# Patient Record
Sex: Female | Born: 1985 | Race: White | Hispanic: No | Marital: Single | Smoking: Never smoker
Health system: Southern US, Community
[De-identification: ages and names within clinical notes are randomized; demographics above are authoritative.]

## PROBLEM LIST (undated history)

## (undated) DIAGNOSIS — J45909 Unspecified asthma, uncomplicated: Secondary | ICD-10-CM

## (undated) DIAGNOSIS — G43909 Migraine, unspecified, not intractable, without status migrainosus: Secondary | ICD-10-CM

## (undated) DIAGNOSIS — F419 Anxiety disorder, unspecified: Secondary | ICD-10-CM

## (undated) DIAGNOSIS — G4700 Insomnia, unspecified: Secondary | ICD-10-CM

## (undated) HISTORY — PX: OTHER SURGICAL HISTORY: SHX169

## (undated) HISTORY — PX: HERNIA REPAIR: SHX51

## (undated) HISTORY — PX: ABDOMINAL HYSTERECTOMY: SHX81

## (undated) HISTORY — PX: TONSILLECTOMY: SUR1361

## (undated) HISTORY — PX: ABDOMINAL SURGERY: SHX537

## (undated) HISTORY — PX: ADENOIDECTOMY: SUR15

## (undated) HISTORY — PX: CHOLECYSTECTOMY: SHX55

## (undated) HISTORY — PX: APPENDECTOMY: SHX54

---
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
## 2003-01-06 ENCOUNTER — Ambulatory Visit (HOSPITAL_COMMUNITY): Admission: RE | Admit: 2003-01-06 | Discharge: 2003-01-06 | Payer: Self-pay | Admitting: Obstetrics and Gynecology

## 2003-01-06 ENCOUNTER — Encounter: Payer: Self-pay | Admitting: Obstetrics and Gynecology

## 2003-06-01 ENCOUNTER — Ambulatory Visit (HOSPITAL_COMMUNITY): Admission: RE | Admit: 2003-06-01 | Discharge: 2003-06-01 | Payer: Self-pay | Admitting: Surgery

## 2003-09-12 ENCOUNTER — Ambulatory Visit (HOSPITAL_BASED_OUTPATIENT_CLINIC_OR_DEPARTMENT_OTHER): Admission: RE | Admit: 2003-09-12 | Discharge: 2003-09-12 | Payer: Self-pay | Admitting: Internal Medicine

## 2004-09-10 ENCOUNTER — Ambulatory Visit: Payer: Self-pay | Admitting: Internal Medicine

## 2005-01-02 ENCOUNTER — Emergency Department (HOSPITAL_COMMUNITY): Admission: EM | Admit: 2005-01-02 | Discharge: 2005-01-02 | Payer: Self-pay | Admitting: Emergency Medicine

## 2006-10-09 ENCOUNTER — Emergency Department (HOSPITAL_COMMUNITY): Admission: EM | Admit: 2006-10-09 | Discharge: 2006-10-09 | Payer: Self-pay | Admitting: Emergency Medicine

## 2015-03-06 ENCOUNTER — Encounter (HOSPITAL_COMMUNITY): Payer: Self-pay | Admitting: Emergency Medicine

## 2015-03-06 ENCOUNTER — Emergency Department (HOSPITAL_COMMUNITY)
Admission: EM | Admit: 2015-03-06 | Discharge: 2015-03-06 | Discharge status: Against Medical Advice | Payer: Medicaid Other | Attending: Emergency Medicine | Admitting: Emergency Medicine

## 2015-03-06 DIAGNOSIS — R531 Weakness: Secondary | ICD-10-CM | POA: Diagnosis not present

## 2015-03-06 DIAGNOSIS — J45909 Unspecified asthma, uncomplicated: Secondary | ICD-10-CM | POA: Insufficient documentation

## 2015-03-06 HISTORY — DX: Unspecified asthma, uncomplicated: J45.909

## 2015-03-06 HISTORY — DX: Insomnia, unspecified: G47.00

## 2015-03-06 HISTORY — DX: Migraine, unspecified, not intractable, without status migrainosus: G43.909

## 2015-03-06 HISTORY — DX: Anxiety disorder, unspecified: F41.9

## 2015-03-06 NOTE — ED Notes (Signed)
Pt called to room no answer in waiting room.

## 2015-03-06 NOTE — ED Notes (Signed)
No answer in waiting room 

## 2015-03-06 NOTE — ED Notes (Signed)
Called x 1 for room assignment. No answer.  

## 2015-03-06 NOTE — ED Notes (Signed)
Pt /co feeling generally weak and tingly with bilateral hand/arm numbness and some slurred speech x 90 minutes ago. Pt states symptoms have since resolved, with the exception of her right arm feeling heavy and light headness. Speech clear. Pt ambulated into triage with steady gait. Pt states she hash/s of anxiety.

## 2015-03-20 ENCOUNTER — Emergency Department (HOSPITAL_COMMUNITY): Payer: Medicaid Other

## 2015-03-20 ENCOUNTER — Encounter (HOSPITAL_COMMUNITY): Payer: Self-pay | Admitting: Emergency Medicine

## 2015-03-20 ENCOUNTER — Emergency Department (HOSPITAL_COMMUNITY)
Admission: EM | Admit: 2015-03-20 | Discharge: 2015-03-20 | Disposition: A | Discharge status: Home / Self Care | Payer: Medicaid Other | Attending: Emergency Medicine | Admitting: Emergency Medicine

## 2015-03-20 DIAGNOSIS — Z880 Allergy status to penicillin: Secondary | ICD-10-CM | POA: Diagnosis not present

## 2015-03-20 DIAGNOSIS — G4700 Insomnia, unspecified: Secondary | ICD-10-CM | POA: Insufficient documentation

## 2015-03-20 DIAGNOSIS — K469 Unspecified abdominal hernia without obstruction or gangrene: Secondary | ICD-10-CM | POA: Diagnosis not present

## 2015-03-20 DIAGNOSIS — J45909 Unspecified asthma, uncomplicated: Secondary | ICD-10-CM | POA: Insufficient documentation

## 2015-03-20 DIAGNOSIS — G43909 Migraine, unspecified, not intractable, without status migrainosus: Secondary | ICD-10-CM | POA: Insufficient documentation

## 2015-03-20 DIAGNOSIS — Z91040 Latex allergy status: Secondary | ICD-10-CM | POA: Diagnosis not present

## 2015-03-20 DIAGNOSIS — R109 Unspecified abdominal pain: Secondary | ICD-10-CM | POA: Diagnosis present

## 2015-03-20 DIAGNOSIS — Z79899 Other long term (current) drug therapy: Secondary | ICD-10-CM | POA: Diagnosis not present

## 2015-03-20 DIAGNOSIS — K458 Other specified abdominal hernia without obstruction or gangrene: Secondary | ICD-10-CM

## 2015-03-20 DIAGNOSIS — Z8659 Personal history of other mental and behavioral disorders: Secondary | ICD-10-CM | POA: Insufficient documentation

## 2015-03-20 LAB — CBC WITH DIFFERENTIAL/PLATELET
Basophils Absolute: 0 10*3/uL (ref 0.0–0.1)
Basophils Relative: 1 % (ref 0–1)
EOS ABS: 0.1 10*3/uL (ref 0.0–0.7)
EOS PCT: 2 % (ref 0–5)
HCT: 43.1 % (ref 36.0–46.0)
Hemoglobin: 14.3 g/dL (ref 12.0–15.0)
LYMPHS ABS: 1.6 10*3/uL (ref 0.7–4.0)
Lymphocytes Relative: 25 % (ref 12–46)
MCH: 33.2 pg (ref 26.0–34.0)
MCHC: 33.2 g/dL (ref 30.0–36.0)
MCV: 100 fL (ref 78.0–100.0)
Monocytes Absolute: 0.3 10*3/uL (ref 0.1–1.0)
Monocytes Relative: 5 % (ref 3–12)
Neutro Abs: 4.4 10*3/uL (ref 1.7–7.7)
Neutrophils Relative %: 67 % (ref 43–77)
PLATELETS: 210 10*3/uL (ref 150–400)
RBC: 4.31 MIL/uL (ref 3.87–5.11)
RDW: 13.4 % (ref 11.5–15.5)
WBC: 6.4 10*3/uL (ref 4.0–10.5)

## 2015-03-20 LAB — COMPREHENSIVE METABOLIC PANEL
ALT: 15 U/L (ref 14–54)
AST: 18 U/L (ref 15–41)
Albumin: 3.9 g/dL (ref 3.5–5.0)
Alkaline Phosphatase: 81 U/L (ref 38–126)
Anion gap: 7 (ref 5–15)
BILIRUBIN TOTAL: 0.7 mg/dL (ref 0.3–1.2)
BUN: 12 mg/dL (ref 6–20)
CHLORIDE: 109 mmol/L (ref 101–111)
CO2: 23 mmol/L (ref 22–32)
CREATININE: 0.73 mg/dL (ref 0.44–1.00)
Calcium: 8.7 mg/dL — ABNORMAL LOW (ref 8.9–10.3)
GFR calc Af Amer: >60 mL/min (ref >60)
Glucose, Bld: 105 mg/dL — ABNORMAL HIGH (ref 65–99)
Potassium: 3.2 mmol/L — ABNORMAL LOW (ref 3.5–5.1)
Sodium: 139 mmol/L (ref 135–145)
TOTAL PROTEIN: 6.4 g/dL — AB (ref 6.5–8.1)

## 2015-03-20 LAB — URINALYSIS, ROUTINE W REFLEX MICROSCOPIC
BILIRUBIN URINE: NEGATIVE
GLUCOSE, UA: NEGATIVE mg/dL
HGB URINE DIPSTICK: NEGATIVE
KETONES UR: NEGATIVE mg/dL
LEUKOCYTES UA: NEGATIVE
Nitrite: NEGATIVE
PROTEIN: NEGATIVE mg/dL
Specific Gravity, Urine: >1.03 — ABNORMAL HIGH (ref 1.005–1.030)
Urobilinogen, UA: 0.2 mg/dL (ref 0.0–1.0)
pH: 5.5 (ref 5.0–8.0)

## 2015-03-20 MED ORDER — ONDANSETRON 4 MG PO TBDP: 4.0000 mg | ORAL_TABLET | Freq: Three times a day (TID) | ORAL | Status: DC | PRN

## 2015-03-20 MED ORDER — IOHEXOL 300 MG/ML  SOLN
25.0000 mL | Freq: Once | INTRAMUSCULAR | Status: AC | PRN
  Administered 2015-03-20: 50 mL via ORAL

## 2015-03-20 MED ORDER — ONDANSETRON 4 MG PO TBDP
4.0000 mg | ORAL_TABLET | Freq: Three times a day (TID) | ORAL | Status: DC | PRN
  Administered 2015-03-20: 4 mg via ORAL
  Filled 2015-03-20: qty 1

## 2015-03-20 MED ORDER — MORPHINE SULFATE (PF) 2 MG/ML IV SOLN
0.5000 mg | INTRAVENOUS | Status: DC | PRN
  Administered 2015-03-20: 0.5 mg via INTRAVENOUS
  Filled 2015-03-20: qty 1

## 2015-03-20 NOTE — ED Notes (Signed)
Pt reports hernia repair in 2009, states she tore the repair approx 3 weeks ago. Pt states she has had increasing abdominal pain and nausea since.

## 2015-03-20 NOTE — ED Notes (Signed)
Pt made aware to return if symptoms worsen or if any life threatening symptoms occur.   

## 2015-03-20 NOTE — ED Provider Notes (Signed)
CSN: [id number]     Arrival date & time 03/20/15  1113 History   First MD Initiated Contact with Patient 03/20/15 1135     Chief Complaint  Patient presents with  . Abdominal Pain   HPI  Daisy Harris is a 29yo female presenting today for abdominal pain. Reports that she tore her hernia repair three weeks ago while she was lifting her textbooks and has had increasing abdominal pain since that time. Notes one day history of nausea and vomiting. Denies fevers. Notes diarrhea x3 weeks since hernia developed, which she states happened last time she had it. Scheduled to see general surgery in a few weeks. History of abdominal hysterectomy, c- section x2, cholecystectomy, hernia repair 2009, Nissen fundoplication x3 noted.  Past Medical History  Diagnosis Date  . Asthma   . Insomnia   . Migraine   . Anxiety    Past Surgical History  Procedure Laterality Date  . Abdominal hysterectomy    . Cholecystectomy    . Tonsillectomy    . Hernia repair    . Abdominal surgery      bowel surgery  . Nissen fundiplication    . Adenoidectomy     Family History  Problem Relation Age of Onset  . Asthma Mother   . Asthma Father   . Asthma Other    Social History  Substance Use Topics  . Smoking status: Never Smoker   . Smokeless tobacco: Never Used  . Alcohol Use: No     Comment: occasional   OB History    Gravida Para Term Preterm AB TAB SAB Ectopic Multiple Living   2 2 2             Review of Systems  Constitutional: Negative for fever.  Gastrointestinal: Positive for nausea, vomiting, abdominal pain and diarrhea. Negative for constipation.      Allergies  Amoxicillin; Azithromycin; Ceftin; Hibiclens; Peanuts; Penicillins; and Latex  Home Medications   Prior to Admission medications   Medication Sig Start Date End Date Taking? Authorizing Provider  albuterol (PROVENTIL HFA;VENTOLIN HFA) 108 (90 BASE) MCG/ACT inhaler Inhale 1 puff into the lungs every 6 (six) hours as needed for  wheezing or shortness of breath.   Yes Historical Provider, MD  atomoxetine (STRATTERA) 40 MG capsule Take 40 mg by mouth daily.   Yes Historical Provider, MD  cevimeline (EVOXAC) 30 MG capsule Take 30 mg by mouth 3 (three) times daily.   Yes Historical Provider, MD  cyanocobalamin (,VITAMIN B-12,) 1000 MCG/ML injection Inject 1,000 mcg into the muscle once a week.   Yes Historical Provider, MD  Doxepin HCl (SILENOR) 6 MG TABS Take 1 tablet by mouth at bedtime.   Yes Historical Provider, MD  EPINEPHrine 0.3 mg/0.3 mL IJ SOAJ injection Inject 0.3 mg into the muscle once.   Yes Historical Provider, MD  esomeprazole (NEXIUM) 40 MG capsule Take 40 mg by mouth daily at 12 noon.   Yes Historical Provider, MD  estradiol (ESTRING) 2 MG vaginal ring Place 2 mg vaginally every 3 (three) months. follow package directions   Yes Historical Provider, MD  Ibuprofen (ADVIL MIGRAINE) 200 MG CAPS Take 1-2 capsules by mouth daily as needed (migraine).   Yes Historical Provider, MD  lamoTRIgine (LAMICTAL) 100 MG tablet Take 100 mg by mouth daily.   Yes Historical Provider, MD  levalbuterol (XOPENEX) 1.25 MG/0.5ML nebulizer solution Take 1.25 mg by nebulization every 4 (four) hours as needed for wheezing or shortness of breath.   Yes Historical  Provider, MD  levocetirizine (XYZAL) 5 MG tablet Take 5 mg by mouth every evening.   Yes Historical Provider, MD  methylphenidate (RITALIN) 10 MG tablet Take 10 mg by mouth 2 (two) times daily.   Yes Historical Provider, MD  montelukast (SINGULAIR) 10 MG tablet Take 10 mg by mouth at bedtime.   Yes Historical Provider, MD  OnabotulinumtoxinA (BOTOX IJ) Inject as directed every 3 (three) months.   Yes Historical Provider, MD  oxyCODONE-acetaminophen (PERCOCET/ROXICET) 5-325 MG per tablet Take 1 tablet by mouth 3 (three) times daily.   Yes Historical Provider, MD  topiramate (TOPAMAX) 50 MG tablet Take 250 mg by mouth at bedtime.   Yes Historical Provider, MD  zolpidem (AMBIEN CR)  12.5 MG CR tablet Take 12.5 mg by mouth at bedtime.   Yes Historical Provider, MD  ondansetron (ZOFRAN-ODT) 4 MG disintegrating tablet Take 1 tablet (4 mg total) by mouth every 8 (eight) hours as needed for nausea or vomiting. 03/20/15   Glencoe N Rumley, DO   BP 133/62 mmHg  Pulse 55  Temp(Src) 97.9 F (36.6 C) (Oral)  Resp 18  Ht 5\' 3"  (1.6 m)  Wt 140 lb (63.504 kg)  BMI 24.81 kg/m2  SpO2 100% Physical Exam  Constitutional: She is oriented to person, place, and time. She appears well-developed and well-nourished. No distress.  Cardiovascular: Normal rate and regular rhythm.  Exam reveals no gallop and no friction rub.   No murmur heard. Pulmonary/Chest: Effort normal and breath sounds normal. No respiratory distress. She has no wheezes. She has no rales.  Abdominal: Soft. Bowel sounds are normal. She exhibits no distension.  Anterior abdominal hernia palpable with some tenderness  Musculoskeletal: Normal range of motion. She exhibits no edema.  Neurological: She is alert and oriented to person, place, and time.  Skin: Skin is warm and dry. No rash noted.  Psychiatric: She has a normal mood and affect. Her behavior is normal.    ED Course  Procedures (including critical care time) Labs Review Labs Reviewed  COMPREHENSIVE METABOLIC PANEL - Abnormal; Notable for the following:    Potassium 3.2 (*)    Glucose, Bld 105 (*)    Calcium 8.7 (*)    Total Protein 6.4 (*)    All other components within normal limits  URINALYSIS, ROUTINE W REFLEX MICROSCOPIC (NOT AT [facility]) - Abnormal; Notable for the following:    Specific Gravity, Urine >1.030 (*)    All other components within normal limits  CBC WITH DIFFERENTIAL/PLATELET    Imaging Review Ct Abdomen Pelvis Wo Contrast  03/20/2015   CLINICAL DATA:  Abdominal pain. History of multiple abdominal surgeries  EXAM: CT ABDOMEN AND PELVIS WITHOUT CONTRAST  TECHNIQUE: Multidetector CT imaging of the abdomen and pelvis was performed following  the standard protocol without IV contrast.  COMPARISON:  None currently available  FINDINGS: BODY WALL: Diffuse abdominal wall scarring and distortion.  LOWER CHEST: Small hiatal hernia with wrapped appearance compatible with history of Nissen fundoplication.  ABDOMEN/PELVIS:  Liver: No focal abnormality.  Biliary: Cholecystectomy.  No common bile duct enlargement.  Pancreas: Unremarkable.  Spleen: Unremarkable.  Adrenals: Unremarkable.  Kidneys and ureters: No hydronephrosis or stone.  Bladder: Unremarkable.  Reproductive: Hysterectomy and probable oophorectomies. Vaginal ring noted.  Bowel: Thickened appearance of the distal colon which is likely from underdistention. No surrounding inflammatory changes. No bowel obstruction. No appendicitis.  Retroperitoneum: No mass or adenopathy.  Peritoneum: No ascites or pneumoperitoneum.  Vascular: No acute abnormality.  OSSEOUS: No acute abnormalities.  IMPRESSION: 1. No acute finding. 2. Small hiatal hernia including Nissen fundoplication.   Electronically Signed   By: Marnee Spring M.D.   On: 03/20/2015 14:31   I have personally reviewed and evaluated these images and lab results as part of my medical decision-making.   EKG Interpretation None      MDM   Final diagnoses:  Recurrent abdominal hernia without obstruction or gangrene, unspecified hernia type  CMP normal except for mild hypokalemia of 3.2, hyperglycemia of 105. CBC normal. Urinalysis normal. CT scan with small hiatal hernia and no acute findings. Stable for discharge with general surgery follow up as scheduled. Will give Zofran prescription for nausea and vomiting. Continue to take home pain medication for pain.     [place], [place] 03/20/15 1459  Geoffery Lyons, MD 03/21/15 [phone]

## 2015-03-20 NOTE — Discharge Instructions (Signed)

## 2015-12-09 ENCOUNTER — Emergency Department (HOSPITAL_COMMUNITY): Payer: Medicaid Other

## 2015-12-09 ENCOUNTER — Encounter (HOSPITAL_COMMUNITY): Payer: Self-pay | Admitting: Emergency Medicine

## 2015-12-09 ENCOUNTER — Emergency Department (HOSPITAL_COMMUNITY)
Admission: EM | Admit: 2015-12-09 | Discharge: 2015-12-09 | Disposition: A | Discharge status: Home / Self Care | Payer: Medicaid Other | Attending: Emergency Medicine | Admitting: Emergency Medicine

## 2015-12-09 DIAGNOSIS — R0789 Other chest pain: Secondary | ICD-10-CM | POA: Diagnosis present

## 2015-12-09 DIAGNOSIS — Z880 Allergy status to penicillin: Secondary | ICD-10-CM | POA: Diagnosis not present

## 2015-12-09 DIAGNOSIS — Z91040 Latex allergy status: Secondary | ICD-10-CM | POA: Diagnosis not present

## 2015-12-09 DIAGNOSIS — J45901 Unspecified asthma with (acute) exacerbation: Secondary | ICD-10-CM

## 2015-12-09 DIAGNOSIS — J45909 Unspecified asthma, uncomplicated: Secondary | ICD-10-CM | POA: Diagnosis not present

## 2015-12-09 LAB — I-STAT CHEM 8, ED
BUN: 17 mg/dL (ref 6–20)
CREATININE: 1.1 mg/dL — AB (ref 0.44–1.00)
Calcium, Ion: 1.19 mmol/L (ref 1.12–1.23)
Chloride: 102 mmol/L (ref 101–111)
Glucose, Bld: 100 mg/dL — ABNORMAL HIGH (ref 65–99)
HEMATOCRIT: 42 % (ref 36.0–46.0)
HEMOGLOBIN: 14.3 g/dL (ref 12.0–15.0)
POTASSIUM: 3.8 mmol/L (ref 3.5–5.1)
SODIUM: 140 mmol/L (ref 135–145)
TCO2: 23 mmol/L (ref 0–100)

## 2015-12-09 LAB — I-STAT TROPONIN, ED: Troponin i, poc: 0 ng/mL (ref 0.00–0.08)

## 2015-12-09 MED ORDER — PREDNISONE 50 MG PO TABS
60.0000 mg | ORAL_TABLET | Freq: Once | ORAL | Status: AC
  Administered 2015-12-09: 60 mg via ORAL
  Filled 2015-12-09: qty 1

## 2015-12-09 MED ORDER — IPRATROPIUM-ALBUTEROL 0.5-2.5 (3) MG/3ML IN SOLN
3.0000 mL | Freq: Once | RESPIRATORY_TRACT | Status: AC
  Administered 2015-12-09: 3 mL via RESPIRATORY_TRACT
  Filled 2015-12-09: qty 3

## 2015-12-09 MED ORDER — ALBUTEROL SULFATE (2.5 MG/3ML) 0.083% IN NEBU
2.5000 mg | INHALATION_SOLUTION | Freq: Once | RESPIRATORY_TRACT | Status: AC
Start: 2015-12-09 — End: 2015-12-09
  Administered 2015-12-09: 2.5 mg via RESPIRATORY_TRACT
  Filled 2015-12-09: qty 3

## 2015-12-09 MED ORDER — ALBUTEROL SULFATE HFA 108 (90 BASE) MCG/ACT IN AERS: 2.0000 | INHALATION_SPRAY | RESPIRATORY_TRACT | Status: AC | PRN

## 2015-12-09 MED ORDER — PREDNISONE 20 MG PO TABS: 40.0000 mg | ORAL_TABLET | Freq: Every day | ORAL | Status: DC

## 2015-12-09 NOTE — ED Provider Notes (Signed)
CSN: [id number]     Arrival date & time 12/09/15  0212 History   First MD Initiated Contact with Patient 12/09/15 0224     Chief Complaint  Patient presents with  . Chest Pain      HPI Pt was seen at 0230.  Per pt, c/o gradual onset and worsening of persistent wheezing and SOB since 1930 last night.  Describes her symptoms as "my asthma is acting up."  Has been using home MDI with transient relief. 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 Denies palpitations, no back pain, no abd pain, no N/V/D, no fevers, no rash.     Past Medical History  Diagnosis Date  . Asthma   . Insomnia   . Migraine   . Anxiety    Past Surgical History  Procedure Laterality Date  . Abdominal hysterectomy    . Cholecystectomy    . Tonsillectomy    . Hernia repair    . Abdominal surgery      bowel surgery  . Nissen fundiplication    . Adenoidectomy    . Cesarean section    . Appendectomy     Family History  Problem Relation Age of Onset  . Asthma Mother   . Asthma Father   . Asthma Other    Social History  Substance Use Topics  . Smoking status: Never Smoker   . Smokeless tobacco: Never Used  . Alcohol Use: No     Comment: occasional   OB History    Gravida Para Term Preterm AB TAB SAB Ectopic Multiple Living   2 2 2             Review of Systems ROS: Statement: All systems negative except as marked or noted in the HPI; Constitutional: Negative for fever and chills. ; ; Eyes: Negative for eye pain, redness and discharge. ; ; ENMT: Negative for ear pain, hoarseness, nasal congestion, sinus pressure and sore throat. ; ; Cardiovascular: Negative for palpitations, diaphoresis, and peripheral edema. ; ; Respiratory: +CP, wheezing, SOB. Negative for cough and stridor. ; ; Gastrointestinal: Negative for nausea, vomiting, diarrhea, abdominal pain, blood in stool, hematemesis, jaundice and rectal bleeding.  . ; ; Genitourinary: Negative for dysuria, flank pain and hematuria. ; ; Musculoskeletal: Negative for back pain and neck pain. Negative for swelling and trauma.; ; Skin: Negative for pruritus, rash, abrasions, blisters, bruising and skin lesion.; ; Neuro: Negative for headache, lightheadedness and neck stiffness. Negative for weakness, altered level of consciousness, altered mental status, extremity weakness, paresthesias, involuntary movement, seizure and syncope.      Allergies  Amoxicillin; Azithromycin; Ceftin; Hibiclens; Peanuts; Penicillins; and Latex  Home Medications   Prior to Admission medications   Medication Sig Start Date End Date Taking? Authorizing Provider  albuterol (PROVENTIL HFA;VENTOLIN HFA) 108 (90 BASE) MCG/ACT inhaler Inhale 1 puff into the lungs every 6 (six) hours as needed for wheezing or shortness of breath.    Historical Provider, MD  atomoxetine (STRATTERA) 40 MG capsule Take 40 mg by mouth daily.    Historical Provider, MD  cevimeline (EVOXAC) 30 MG capsule Take 30 mg by mouth 3 (three) times daily.    Historical Provider, MD  cyanocobalamin (,VITAMIN B-12,) 1000 MCG/ML injection Inject 1,000 mcg into the muscle once a week.    Historical Provider, MD  Doxepin HCl (SILENOR) 6 MG TABS Take 1 tablet by mouth at bedtime.  Historical Provider, MD  EPINEPHrine 0.3 mg/0.3 mL IJ SOAJ injection Inject 0.3 mg into the muscle once.    Historical Provider, MD  esomeprazole (NEXIUM) 40 MG capsule Take 40 mg by mouth daily at 12 noon.    Historical Provider, MD  estradiol (ESTRING) 2 MG vaginal ring Place 2 mg vaginally every 3 (three) months. follow package directions    Historical Provider, MD  Ibuprofen (ADVIL MIGRAINE) 200 MG CAPS Take 1-2 capsules by mouth daily as needed (migraine).    Historical Provider, MD  lamoTRIgine (LAMICTAL) 100 MG tablet Take 100 mg by mouth daily.    Historical Provider, MD  levalbuterol (XOPENEX) 1.25 MG/0.5ML nebulizer solution Take 1.25 mg  by nebulization every 4 (four) hours as needed for wheezing or shortness of breath.    Historical Provider, MD  levocetirizine (XYZAL) 5 MG tablet Take 5 mg by mouth every evening.    Historical Provider, MD  methylphenidate (RITALIN) 10 MG tablet Take 10 mg by mouth 2 (two) times daily.    Historical Provider, MD  montelukast (SINGULAIR) 10 MG tablet Take 10 mg by mouth at bedtime.    Historical Provider, MD  OnabotulinumtoxinA (BOTOX IJ) Inject as directed every 3 (three) months.    Historical Provider, MD  ondansetron (ZOFRAN-ODT) 4 MG disintegrating tablet Take 1 tablet (4 mg total) by mouth every 8 (eight) hours as needed for nausea or vomiting. 03/20/15   Lora Havensaleigh N Rumley, DO  oxyCODONE-acetaminophen (PERCOCET/ROXICET) 5-325 MG per tablet Take 1 tablet by mouth 3 (three) times daily.    Historical Provider, MD  topiramate (TOPAMAX) 50 MG tablet Take 250 mg by mouth at bedtime.    Historical Provider, MD  zolpidem (AMBIEN CR) 12.5 MG CR tablet Take 12.5 mg by mouth at bedtime.    Historical Provider, MD   BP 134/81 mmHg  Pulse 95  Temp(Src) 97.6 F (36.4 C) (Oral)  Resp 18  Ht 5\' 3"  (1.6 m)  Wt 170 lb (77.111 kg)  BMI 30.12 kg/m2  SpO2 96% Physical Exam  0235: Physical examination:  Nursing notes reviewed; Vital signs and O2 SAT reviewed;  Constitutional: Well developed, Well nourished, Well hydrated, In no acute distress; Head:  Normocephalic, atraumatic; Eyes: EOMI, PERRL, No scleral icterus; ENMT: Mouth and pharynx normal, Mucous membranes moist; Neck: Supple, Full range of motion, No lymphadenopathy; Cardiovascular: Regular rate and rhythm, No murmur, rub, or gallop; Respiratory: Breath sounds diminished & equal bilaterally, scattered exp wheezes. No audible wheezing. Speaking full sentences with ease, Normal respiratory effort/excursion; Chest: Nontender, Movement normal; Abdomen: Soft, Nontender, Nondistended, Normal bowel sounds; Genitourinary: No CVA tenderness; Extremities: Pulses  normal, No tenderness, No edema, No calf edema or asymmetry.; Neuro: AA&Ox3, Major CN grossly intact.  Speech clear. No gross focal motor or sensory deficits in extremities.; Skin: Color normal, Warm, Dry.   ED Course  Procedures (including critical care time) Labs Review   Imaging Review  I have personally reviewed and evaluated these images and lab results as part of my medical decision-making.   EKG Interpretation   Date/Time:  Saturday Dec 09 2015 02:24:23 EDT Ventricular Rate:  96 PR Interval:  158 QRS Duration: 86 QT Interval:  360 QTC Calculation: 455 R Axis:   7 Text Interpretation:  Sinus rhythm LVH by voltage When compared with ECG  of 03/06/2015 No significant change was found Confirmed by [facility] Center[facility]CMANUS  MD,  Nicholos JohnsKATHLEEN [phone]([id number]) on 12/09/2015 3:06:50 AM      MDM  MDM Reviewed: previous chart, nursing note and  vitals Reviewed previous: labs and ECG Interpretation: labs, ECG and x-ray     Results for orders placed or performed during the hospital encounter of 12/09/15  I-stat Chem 8, ED  Result Value Ref Range   Sodium 140 135 - 145 mmol/L   Potassium 3.8 3.5 - 5.1 mmol/L   Chloride 102 101 - 111 mmol/L   BUN 17 6 - 20 mg/dL   Creatinine, Ser 1.61 (H) 0.44 - 1.00 mg/dL   Glucose, Bld 096 (H) 65 - 99 mg/dL   Calcium, Ion 0.45 4.09 - 1.23 mmol/L   TCO2 23 0 - 100 mmol/L   Hemoglobin 14.3 12.0 - 15.0 g/dL   HCT 81.1 91.4 - 78.2 %  I-stat troponin, ED  Result Value Ref Range   Troponin i, poc 0.00 0.00 - 0.08 ng/mL   Comment 3           Dg Chest 2 View 12/09/2015  CLINICAL DATA:  Generalized chest pain and shortness of breath since 8 p.m. last night. Headache. History of asthma. EXAM: CHEST  2 VIEW COMPARISON:  None. FINDINGS: The heart size and mediastinal contours are within normal limits. Both lungs are clear. The visualized skeletal structures are unremarkable. IMPRESSION: No active cardiopulmonary disease. Electronically Signed   By: Burman Nieves M.D.   On:  12/09/2015 03:30     0340:  Doubt PE as cause for symptoms with normal d-dimer, PERC negative, and low risk Wells.  Doubt ACS as cause for symptoms with normal troponin and unchanged EKG from previous after 8 hours of constant symptoms. Pt states she "feels better" after neb and steroid.  NAD, lungs CTA bilat, no wheezing, resps easy, speaking full sentences, Sats 100% R/A.  Pt states she wants to go home now. Dx and testing d/w pt.  Questions answered.  Verb understanding, agreeable to d/c home with outpt f/u.     Samuel Jester, DO 12/12/15 1816

## 2015-12-09 NOTE — Discharge Instructions (Signed)
Take the prescriptions as directed.  Use your albuterol inhaler (2 to 4 puffs) every 4 hours for the next 7 days, then as needed for cough, wheezing, or shortness of breath.  Call your regular medical doctor Monday morning to schedule a follow up appointment within the next 3 days.  Return to the Emergency Department immediately sooner if worsening.  ° °

## 2015-12-09 NOTE — ED Notes (Signed)
Pt states she started a new job and started having chest pain with SOB around 0100.  Pt took a break and used inhaler.  Had some relief until she returned to work.  Now having chest pain described as tightness and headache

## 2015-12-15 ENCOUNTER — Emergency Department (HOSPITAL_COMMUNITY)
Admission: EM | Admit: 2015-12-15 | Discharge: 2015-12-15 | Disposition: A | Discharge status: Home / Self Care | Payer: Medicaid Other | Source: Home / Self Care | Attending: Emergency Medicine | Admitting: Emergency Medicine

## 2015-12-15 ENCOUNTER — Emergency Department (HOSPITAL_COMMUNITY)
Admission: EM | Admit: 2015-12-15 | Discharge: 2015-12-16 | Disposition: A | Discharge status: Home / Self Care | Payer: Medicaid Other | Attending: Emergency Medicine | Admitting: Emergency Medicine

## 2015-12-15 ENCOUNTER — Encounter (HOSPITAL_COMMUNITY): Payer: Self-pay | Admitting: Emergency Medicine

## 2015-12-15 ENCOUNTER — Encounter (HOSPITAL_COMMUNITY): Payer: Self-pay

## 2015-12-15 DIAGNOSIS — R079 Chest pain, unspecified: Secondary | ICD-10-CM | POA: Insufficient documentation

## 2015-12-15 DIAGNOSIS — Z791 Long term (current) use of non-steroidal anti-inflammatories (NSAID): Secondary | ICD-10-CM | POA: Diagnosis not present

## 2015-12-15 DIAGNOSIS — T7801XD Anaphylactic reaction due to peanuts, subsequent encounter: Secondary | ICD-10-CM | POA: Insufficient documentation

## 2015-12-15 DIAGNOSIS — J45909 Unspecified asthma, uncomplicated: Secondary | ICD-10-CM | POA: Insufficient documentation

## 2015-12-15 DIAGNOSIS — T781XXA Other adverse food reactions, not elsewhere classified, initial encounter: Secondary | ICD-10-CM

## 2015-12-15 DIAGNOSIS — R0789 Other chest pain: Secondary | ICD-10-CM | POA: Insufficient documentation

## 2015-12-15 DIAGNOSIS — T782XXD Anaphylactic shock, unspecified, subsequent encounter: Secondary | ICD-10-CM

## 2015-12-15 DIAGNOSIS — R51 Headache: Secondary | ICD-10-CM | POA: Insufficient documentation

## 2015-12-15 DIAGNOSIS — Z79899 Other long term (current) drug therapy: Secondary | ICD-10-CM | POA: Insufficient documentation

## 2015-12-15 DIAGNOSIS — T7840XA Allergy, unspecified, initial encounter: Secondary | ICD-10-CM

## 2015-12-15 MED ORDER — DIPHENHYDRAMINE HCL 50 MG/ML IJ SOLN
25.0000 mg | Freq: Once | INTRAMUSCULAR | Status: AC
  Administered 2015-12-15: 25 mg via INTRAVENOUS
  Filled 2015-12-15: qty 1

## 2015-12-15 MED ORDER — EPINEPHRINE 0.3 MG/0.3ML IJ SOAJ: 0.3000 mg | Freq: Once | INTRAMUSCULAR | Status: AC

## 2015-12-15 MED ORDER — DEXAMETHASONE SODIUM PHOSPHATE 10 MG/ML IJ SOLN
10.0000 mg | Freq: Once | INTRAMUSCULAR | Status: AC
  Administered 2015-12-15: 10 mg via INTRAVENOUS
  Filled 2015-12-15: qty 1

## 2015-12-15 MED ORDER — EPINEPHRINE 0.3 MG/0.3ML IJ SOAJ
0.3000 mg | Freq: Once | INTRAMUSCULAR | Status: AC
  Administered 2015-12-15: 0.3 mg via INTRAMUSCULAR
  Filled 2015-12-15: qty 0.3

## 2015-12-15 MED ORDER — METHYLPREDNISOLONE SODIUM SUCC 125 MG IJ SOLR
125.0000 mg | Freq: Once | INTRAMUSCULAR | Status: AC
  Administered 2015-12-15: 125 mg via INTRAVENOUS
  Filled 2015-12-15: qty 2

## 2015-12-15 MED ORDER — DIPHENHYDRAMINE HCL 50 MG/ML IJ SOLN
50.0000 mg | Freq: Once | INTRAMUSCULAR | Status: AC
  Administered 2015-12-15: 50 mg via INTRAVENOUS
  Filled 2015-12-15: qty 1

## 2015-12-15 MED ORDER — SODIUM CHLORIDE 0.9 % IV BOLUS (SEPSIS)
1000.0000 mL | Freq: Once | INTRAVENOUS | Status: AC
  Administered 2015-12-15: 1000 mL via INTRAVENOUS

## 2015-12-15 MED ORDER — FAMOTIDINE IN NACL 20-0.9 MG/50ML-% IV SOLN
20.0000 mg | Freq: Once | INTRAVENOUS | Status: AC
  Administered 2015-12-15: 20 mg via INTRAVENOUS
  Filled 2015-12-15: qty 50

## 2015-12-15 MED ORDER — PREDNISONE 20 MG PO TABS: 40.0000 mg | ORAL_TABLET | Freq: Every day | ORAL | Status: DC

## 2015-12-15 NOTE — ED Notes (Signed)
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 Pt states CP, SOB, and feeling of throat closing. 2 epi pens used and 50 mg of Benadryl IV per EMS.

## 2015-12-15 NOTE — ED Provider Notes (Signed)
Recheck:   Feeling better. Ready to go home.  Donnetta HutchingBrian Carlesha Seiple, MD 12/15/15 [phone]

## 2015-12-15 NOTE — ED Provider Notes (Signed)
CSN: [id number]     Arrival date & time 12/15/15  4098 History   First MD Initiated Contact with Patient 12/15/15 0518     Chief Complaint  Patient presents with  . Chest Pain  . Allergic Reaction     (Consider location/radiation/quality/duration/timing/severity/associated sxs/prior Treatment) Patient is a 30 y.o. female presenting with chest pain and allergic reaction. The history is provided by the patient.  Chest Pain Allergic Reaction She has a history of asthma and allergy. 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 She thinks she was exposed to some peanuts this evening when she noted that her face got blotchy and her throat felt like it is closing up. She was having some difficulty breathing. She used her EpiPen twice and her albuterol inhaler and came to the emergency department. She still feels like her throat is mildly closing but breathing has improved. She had chest pain initially but that is resolved. EMS apparently gave her 50 mg of diphenhydramine en route. Of note, she had been seen in the emergency department 5 days ago and is just completing a course of prednisone 40 mg a day.  Past Medical History  Diagnosis Date  . Asthma   . Insomnia   . Migraine   . Anxiety    Past Surgical History  Procedure Laterality Date  . Abdominal hysterectomy    . Cholecystectomy    . Tonsillectomy    . Hernia repair    . Abdominal surgery      bowel surgery  . Nissen fundiplication    . Adenoidectomy    . Cesarean section    . Appendectomy     Family History  Problem Relation Age of Onset  . Asthma Mother   . Asthma Father   . Asthma Other    Social History  Substance Use Topics  . Smoking status: Never Smoker   . Smokeless tobacco: Never Used  . Alcohol Use: No     Comment: occasional   OB History    Gravida Para Term Preterm AB TAB SAB Ectopic Multiple Living   2 2 2             Review of Systems  Cardiovascular: Positive for chest  pain.  All other systems reviewed and are negative.     Allergies  Amoxicillin; Azithromycin; Ceftin; Ciprofloxacin; Hibiclens; Peanuts; Penicillins; and Latex  Home Medications   Prior to Admission medications   Medication Sig Start Date End Date Taking? Authorizing Provider  albuterol (PROVENTIL HFA;VENTOLIN HFA) 108 (90 Base) MCG/ACT inhaler Inhale 2 puffs into the lungs every 4 (four) hours as needed for wheezing or shortness of breath. 12/09/15   Samuel Jester, DO  atomoxetine (STRATTERA) 40 MG capsule Take 40 mg by mouth daily.    Historical Provider, MD  cevimeline (EVOXAC) 30 MG capsule Take 30 mg by mouth 3 (three) times daily.    Historical Provider, MD  cyanocobalamin (,VITAMIN B-12,) 1000 MCG/ML injection Inject 1,000 mcg into the muscle once a week.    Historical Provider, MD  Doxepin HCl (SILENOR) 6 MG TABS Take 1 tablet by mouth at bedtime.    Historical Provider, MD  EPINEPHrine 0.3 mg/0.3 mL IJ SOAJ injection Inject 0.3 mg into the muscle once.    Historical Provider, MD  esomeprazole (NEXIUM) 40 MG capsule Take 40 mg by mouth daily at 12 noon.    Historical Provider, MD  estradiol (ESTRING) 2 MG vaginal ring Place 2 mg vaginally every  3 (three) months. follow package directions    Historical Provider, MD  Ibuprofen (ADVIL MIGRAINE) 200 MG CAPS Take 1-2 capsules by mouth daily as needed (migraine).    Historical Provider, MD  lamoTRIgine (LAMICTAL) 100 MG tablet Take 100 mg by mouth daily.    Historical Provider, MD  levalbuterol (XOPENEX) 1.25 MG/0.5ML nebulizer solution Take 1.25 mg by nebulization every 4 (four) hours as needed for wheezing or shortness of breath.    Historical Provider, MD  levocetirizine (XYZAL) 5 MG tablet Take 5 mg by mouth every evening.    Historical Provider, MD  methylphenidate (RITALIN) 10 MG tablet Take 10 mg by mouth 2 (two) times daily.    Historical Provider, MD  montelukast (SINGULAIR) 10 MG tablet Take 10 mg by mouth at bedtime.     Historical Provider, MD  OnabotulinumtoxinA (BOTOX IJ) Inject as directed every 3 (three) months.    Historical Provider, MD  ondansetron (ZOFRAN-ODT) 4 MG disintegrating tablet Take 1 tablet (4 mg total) by mouth every 8 (eight) hours as needed for nausea or vomiting. 03/20/15   Lora Havens Rumley, DO  oxyCODONE-acetaminophen (PERCOCET/ROXICET) 5-325 MG per tablet Take 1 tablet by mouth 3 (three) times daily.    Historical Provider, MD  predniSONE (DELTASONE) 20 MG tablet Take 2 tablets (40 mg total) by mouth daily. 12/09/15   Samuel Jester, DO  topiramate (TOPAMAX) 50 MG tablet Take 250 mg by mouth at bedtime.    Historical Provider, MD  zolpidem (AMBIEN CR) 12.5 MG CR tablet Take 12.5 mg by mouth at bedtime.    Historical Provider, MD   There were no vitals taken for this visit. Physical Exam  Nursing note and vitals reviewed.  30 year old female, somewhat anxious and jittery, but in no acute distress. Vital signs are significant for tachycardia. Oxygen saturation is 99%, which is normal. Head is normocephalic and atraumatic. PERRLA, EOMI. Oropharynx is clear. There is no edema of the uvula or soft tissues the pharynx. There is no stridor. There is no pooling of secretions and phonation is normal. Neck is nontender and supple without adenopathy or JVD. Back is nontender and there is no CVA tenderness. Lungs are clear without rales, wheezes, or rhonchi. Chest is nontender. Heart his tachycardic without murmur. Abdomen is soft, flat, nontender without masses or hepatosplenomegaly and peristalsis is normoactive. Extremities have no cyanosis or edema, full range of motion is present. Skin is warm and dry without rash. Neurologic: Mental status is normal, cranial nerves are intact, there are no motor or sensory deficits.  ED Course  Procedures (including critical care time)   EKG Interpretation   Date/Time:  Friday Dec 15 2015 05:17:24 EDT Ventricular Rate:  110 PR Interval:  182 QRS  Duration: 96 QT Interval:  329 QTC Calculation: 445 R Axis:   -8 Text Interpretation:  Sinus tachycardia Left atrial enlargement Left  ventricular hypertrophy When compared with ECG of 12/09/2015, No  significant change was found Confirmed by [facility]  MD, Mckenzie Toruno ([id number]) on  12/15/2015 5:20:46 AM      MDM   Final diagnoses:  Allergic reaction, initial encounter    Apparent allergic reaction to peanuts. Although anxious, patient does not appear to be in distress currently. No evidence of ongoing bronchospasm. She's given additional diphenhydramine as well as famotidine and methylprednisolone and will need to be observed in the emergency department. Review of old records confirms recent ED visit for asthma exacerbation.  7:43 AM Following above-noted treatment, she feels much better.  Some of her facial flushing has resolved, heart rate has come down and she is much more comfortable. She will continue to be observed in the ED for another hour. She will be discharged with prescriptions for an new EpiPen and for a 5 day course of prednisone.  Dione Boozeavid Alannis Hsia, MD 12/15/15 [phone]

## 2015-12-15 NOTE — ED Notes (Signed)
Pt states CP started prior to reaction. Reports her throat "hasn't opened up all the way".

## 2015-12-15 NOTE — ED Notes (Signed)
Pt c/o increasing chest tightness and states she is "breaking out". No obvious rash or welts noted at this time. MD Preston FleetingGlick notified.

## 2015-12-15 NOTE — ED Provider Notes (Signed)
CSN: [id number]     Arrival date & time 12/15/15  2247 History  By signing my name below, I, [facility], attest that this documentation has been prepared under the direction and in the presence of [name], MD. Electronically Signed: Randell Patient, ED Scribe. 12/16/2015. 12:31 AM.    Chief Complaint  Patient presents with  . Allergic Reaction   The history is provided by the patient. No language interpreter was used.  HPI Comments: Daisy Harris is a 30 y.o. female with an hx of asthma and a severe peanut allergy who presents to the Emergency Department complaining of a possible allergic reaction that occurred 20 minutes PTA. Pt states that she had an allergic reaction to peanuts earlier this morning after which she used 2 EpiPens, was transported to the ED, and was evaluated here at the [facility] ED by Dr. Donnetta Hutching. She was discharged with prescription for prednisone which she has not filled. She reports that her symptoms re-occurred this evening when she felt feverish followed by a sensation of swelling in her throat, an erythematous rash to her face and chest, chest tightness, and a HA. Per pt, she notes similar symptoms in the past when she was exposed to peanuts that required treatment in the hospital. Denies re-exposure to peanuts. Denies any other symptoms currently.  Past Medical History  Diagnosis Date  . Asthma   . Insomnia   . Migraine   . Anxiety    Past Surgical History  Procedure Laterality Date  . Abdominal hysterectomy    . Cholecystectomy    . Tonsillectomy    . Hernia repair    . Abdominal surgery      bowel surgery  . Nissen fundiplication    . Adenoidectomy    . Cesarean section    . Appendectomy     Family History  Problem Relation Age of Onset  . Asthma Mother   . Asthma Father   . Asthma Other    Social History  Substance Use Topics  . Smoking status: Never Smoker   . Smokeless tobacco: Never Used  . Alcohol Use: No     Comment:  occasional   OB History    Gravida Para Term Preterm AB TAB SAB Ectopic Multiple Living   2 2 2             Review of Systems  HENT: Positive for trouble swallowing.   Respiratory: Positive for chest tightness.   Skin: Positive for color change and rash.  Neurological: Positive for headaches.  All other systems reviewed and are negative.   Allergies  Amoxicillin; Azithromycin; Ceftin; Ciprofloxacin; Hibiclens; Peanuts; Penicillins; and Latex  Home Medications   Prior to Admission medications   Medication Sig Start Date End Date Taking? Authorizing Provider  albuterol (PROVENTIL HFA;VENTOLIN HFA) 108 (90 Base) MCG/ACT inhaler Inhale 2 puffs into the lungs every 4 (four) hours as needed for wheezing or shortness of breath. 12/09/15  Yes Samuel Jester, DO  EPINEPHrine 0.3 mg/0.3 mL IJ SOAJ injection Inject 0.3 mLs (0.3 mg total) into the muscle once. 12/15/15  Yes Dione Booze, MD  estradiol (ESTRACE) 2 MG tablet Take 2 mg by mouth daily.   Yes Historical Provider, MD  Ibuprofen (ADVIL MIGRAINE) 200 MG CAPS Take 1-2 capsules by mouth daily as needed (for pain).   Yes Historical Provider, MD  levalbuterol (XOPENEX) 1.25 MG/0.5ML nebulizer solution Take 1.25 mg by nebulization every 4 (four) hours as needed for wheezing or shortness of breath.  Yes Historical Provider, MD  predniSONE (DELTASONE) 20 MG tablet Take 2 tablets (40 mg total) by mouth daily. 12/15/15   Dione Boozeavid Glick, MD   BP 118/72 mmHg  Pulse 82  Temp(Src) 98.1 F (36.7 C) (Oral)  Resp 24  Ht 5\' 3"  (1.6 m)  Wt 180 lb (81.647 kg)  BMI 31.89 kg/m2  SpO2 100% Physical Exam  Constitutional: She is oriented to person, place, and time. She appears well-developed and well-nourished. No distress.  HENT:  Head: Normocephalic and atraumatic.  No significant swelling appreciated. No significant angioedema.  Eyes: Conjunctivae are normal.  Neck: Normal range of motion.  No stridor.  Cardiovascular: Normal rate.    Pulmonary/Chest: Effort normal. No stridor. No respiratory distress.   Lungs CTA bilaterally.  Musculoskeletal: Normal range of motion.  Neurological: She is alert and oriented to person, place, and time.  Skin: Skin is warm and dry. Rash noted. There is erythema.  Mild erythematous rash to the bilateral face and chest with warmth to palpation and evidence of hives.  Psychiatric: She has a normal mood and affect. Her behavior is normal.  Nursing note and vitals reviewed.   ED Course  Procedures (including critical care time) CRITICAL CARE Performed by: Enid SkeensZAVITZ, Ilyana Manuele M   Total critical care time: 35 minutes  Critical care time was exclusive of separately billable procedures and treating other patients.  Critical care was necessary to treat or prevent imminent or life-threatening deterioration.  Critical care was time spent personally by me on the following activities: development of treatment plan with patient and/or surrogate as well as nursing, discussions with consultants, evaluation of patient's response to treatment, examination of patient, obtaining history from patient or surrogate, ordering and performing treatments and interventions, ordering and review of laboratory studies, ordering and review of radiographic studies, pulse oximetry and re-evaluation of patient's condition.  DIAGNOSTIC STUDIES: Oxygen Saturation is 100% on RA, normal by my interpretation.    COORDINATION OF CARE: 11:04 PM Will order Epi-Pen, Decadron, Benadryl, and IV fluids. Discussed treatment plan with pt at bedside and pt agreed to plan.   Labs Review Labs Reviewed - No data to display  Imaging Review No results found. I have personally reviewed and evaluated these images and lab results as part of my medical decision-making.   EKG Interpretation None      MDM   Final diagnoses:  Anaphylaxis, subsequent encounter   Patient presents with recurrent anaphylaxis. Patient recent discharge  for similar. Epinephrine, IV fluids, Benadryl ordered. Plan for observation and likely  close outpatient follow-up.   Sxs resolved on multiple rechecks, observed in ED for 6 hrs.  Results and differential diagnosis were discussed with the patient/parent/guardian. Xrays were independently reviewed by myself.  Close follow up outpatient was discussed, comfortable with the plan.   Medications  EPINEPHrine (EPI-PEN) injection 0.3 mg (0.3 mg Intramuscular Given 12/15/15 2316)  dexamethasone (DECADRON) injection 10 mg (10 mg Intravenous Given 12/15/15 2316)  diphenhydrAMINE (BENADRYL) injection 50 mg (50 mg Intravenous Given 12/15/15 2316)  sodium chloride 0.9 % bolus 1,000 mL (1,000 mLs Intravenous New Bag/Given 12/15/15 2316)    Filed Vitals:   12/15/15 2254 12/15/15 2330  BP: 129/83 118/72  Pulse: 68 82  Temp: 98.1 F (36.7 C)   TempSrc: Oral   Resp: 16 24  Height: 5\' 3"  (1.6 m)   Weight: 180 lb (81.647 kg)   SpO2: 100% 100%    Final diagnoses:  Anaphylaxis, subsequent encounter      Blane OharaJoshua Coye Dawood,  MD 12/16/15 1610

## 2015-12-15 NOTE — Discharge Instructions (Signed)

## 2015-12-15 NOTE — ED Notes (Signed)
Pt was seen here in the e.d. Last night for allergic reaction to peanuts.  Pt states she had been doing fine until about 20 mins ago when her face started feeling hot and the right side of her throat feels like it is closing up.   No resp distress noted at this time.

## 2015-12-15 NOTE — ED Notes (Signed)
Patient ambulated to restroom at this time.

## 2015-12-16 NOTE — ED Notes (Signed)
Pt alert & oriented x4, stable gait. Patient given discharge instructions, paperwork & prescription(s). Patient  instructed to stop at the registration desk to finish any additional paperwork. Patient verbalized understanding. Pt left department w/ no further questions. 

## 2015-12-16 NOTE — Discharge Instructions (Signed)
If you were given medicines take as directed.  If you are on coumadin or contraceptives realize their levels and effectiveness is altered by many different medicines.  If you have any reaction (rash, tongues swelling, other) to the medicines stop taking and see a physician.    If your blood pressure was elevated in the ER make sure you follow up for management with a primary doctor or return for chest pain, throat or tongue swelling, shortness of breath or stroke symptoms.  Please follow up as directed and return to the ER or see a physician for new or worsening symptoms.  Thank you. Filed Vitals:   12/16/15 0200 12/16/15 0230 12/16/15 0330 12/16/15 0400  BP: 116/75 114/75 115/70 116/79  Pulse: 66 71 78 81  Temp:      TempSrc:      Resp: 18 20 16 21   Height:      Weight:      SpO2: 100% 100% 100% 94%

## 2015-12-16 NOTE — ED Notes (Signed)
Pt removed from monitor. Pt asked if she was calling a ride. States no one to pick her up until the morning. Pt informed normally not allowed to stay in room but she would be allowed to stay until 0600.

## 2015-12-16 NOTE — ED Notes (Signed)
Pt states she started having chest pains again about 5 minutes ago. EDP notified.

## 2015-12-16 NOTE — ED Notes (Signed)
Pt provided snack at this time. 

## 2015-12-16 NOTE — ED Notes (Signed)
Pt c/o face feeling hot and "red spots" on arms.  No discoloration noted on assessment.  No distress talking or breathing.  Dr. Jodi MourningZavitz made aware.

## 2016-07-20 ENCOUNTER — Emergency Department (HOSPITAL_COMMUNITY)
Admission: EM | Admit: 2016-07-20 | Discharge: 2016-07-20 | Disposition: A | Discharge status: Home / Self Care | Payer: Medicaid Other | Attending: Emergency Medicine | Admitting: Emergency Medicine

## 2016-07-20 ENCOUNTER — Encounter (HOSPITAL_COMMUNITY): Payer: Self-pay | Admitting: *Deleted

## 2016-07-20 ENCOUNTER — Emergency Department (HOSPITAL_COMMUNITY): Payer: Medicaid Other

## 2016-07-20 DIAGNOSIS — Z79899 Other long term (current) drug therapy: Secondary | ICD-10-CM | POA: Insufficient documentation

## 2016-07-20 DIAGNOSIS — J45909 Unspecified asthma, uncomplicated: Secondary | ICD-10-CM | POA: Insufficient documentation

## 2016-07-20 DIAGNOSIS — R630 Anorexia: Secondary | ICD-10-CM | POA: Insufficient documentation

## 2016-07-20 DIAGNOSIS — R1030 Lower abdominal pain, unspecified: Secondary | ICD-10-CM | POA: Insufficient documentation

## 2016-07-20 LAB — URINALYSIS, ROUTINE W REFLEX MICROSCOPIC
Bilirubin Urine: NEGATIVE
Glucose, UA: NEGATIVE mg/dL
Hgb urine dipstick: NEGATIVE
Ketones, ur: NEGATIVE mg/dL
LEUKOCYTES UA: NEGATIVE
Nitrite: NEGATIVE
PROTEIN: NEGATIVE mg/dL
SPECIFIC GRAVITY, URINE: 1.023 (ref 1.005–1.030)
pH: 5 (ref 5.0–8.0)

## 2016-07-20 LAB — CBC WITH DIFFERENTIAL/PLATELET
BASOS ABS: 0 10*3/uL (ref 0.0–0.1)
Basophils Relative: 1 %
EOS ABS: 0.3 10*3/uL (ref 0.0–0.7)
EOS PCT: 6 %
HCT: 41.7 % (ref 36.0–46.0)
Hemoglobin: 13.5 g/dL (ref 12.0–15.0)
LYMPHS PCT: 42 %
Lymphs Abs: 2.1 10*3/uL (ref 0.7–4.0)
MCH: 28.7 pg (ref 26.0–34.0)
MCHC: 32.4 g/dL (ref 30.0–36.0)
MCV: 88.7 fL (ref 78.0–100.0)
Monocytes Absolute: 0.3 10*3/uL (ref 0.1–1.0)
Monocytes Relative: 7 %
Neutro Abs: 2.2 10*3/uL (ref 1.7–7.7)
Neutrophils Relative %: 44 %
PLATELETS: 214 10*3/uL (ref 150–400)
RBC: 4.7 MIL/uL (ref 3.87–5.11)
RDW: 14.6 % (ref 11.5–15.5)
WBC: 5 10*3/uL (ref 4.0–10.5)

## 2016-07-20 LAB — COMPREHENSIVE METABOLIC PANEL
ALT: 19 U/L (ref 14–54)
AST: 19 U/L (ref 15–41)
Albumin: 3.6 g/dL (ref 3.5–5.0)
Alkaline Phosphatase: 80 U/L (ref 38–126)
Anion gap: 4 — ABNORMAL LOW (ref 5–15)
BUN: 14 mg/dL (ref 6–20)
CHLORIDE: 109 mmol/L (ref 101–111)
CO2: 28 mmol/L (ref 22–32)
CREATININE: 0.64 mg/dL (ref 0.44–1.00)
Calcium: 9.1 mg/dL (ref 8.9–10.3)
Glucose, Bld: 103 mg/dL — ABNORMAL HIGH (ref 65–99)
POTASSIUM: 3.9 mmol/L (ref 3.5–5.1)
SODIUM: 141 mmol/L (ref 135–145)
Total Bilirubin: 0.6 mg/dL (ref 0.3–1.2)
Total Protein: 6.4 g/dL — ABNORMAL LOW (ref 6.5–8.1)

## 2016-07-20 LAB — LIPASE, BLOOD: LIPASE: 22 U/L (ref 11–51)

## 2016-07-20 MED ORDER — IOPAMIDOL (ISOVUE-300) INJECTION 61%
100.0000 mL | Freq: Once | INTRAVENOUS | Status: AC | PRN
  Administered 2016-07-20: 100 mL via INTRAVENOUS

## 2016-07-20 MED ORDER — OXYCODONE-ACETAMINOPHEN 5-325 MG PO TABS: 1.0000 | ORAL_TABLET | Freq: Four times a day (QID) | ORAL | 0 refills | Status: DC | PRN

## 2016-07-20 MED ORDER — IOPAMIDOL (ISOVUE-300) INJECTION 61%
INTRAVENOUS | Status: AC
  Filled 2016-07-20: qty 30

## 2016-07-20 MED ORDER — ONDANSETRON 4 MG PO TBDP: ORAL_TABLET | ORAL | 0 refills | Status: DC

## 2016-07-20 MED ORDER — HYDROMORPHONE HCL 1 MG/ML IJ SOLN
1.0000 mg | Freq: Once | INTRAMUSCULAR | Status: AC
  Administered 2016-07-20: 1 mg via INTRAVENOUS
  Filled 2016-07-20: qty 1

## 2016-07-20 MED ORDER — ONDANSETRON HCL 4 MG/2ML IJ SOLN
4.0000 mg | Freq: Once | INTRAMUSCULAR | Status: AC
  Administered 2016-07-20: 4 mg via INTRAVENOUS
  Filled 2016-07-20: qty 2

## 2016-07-20 NOTE — ED Triage Notes (Signed)
Pt is here for abdominal pain in right side of abdomen which she feels may be from her hernia repair having torn.  Pt denies any GU symptoms.  Pt states that the pain feels the same as when her mesh hernia repair tore.  No n/v or fever with this.

## 2016-07-20 NOTE — Discharge Instructions (Signed)
Follow-up with Dr.Rehman next week for follow-up with your surgeon in [place]

## 2016-07-20 NOTE — ED Notes (Signed)
Patient transported to CT 

## 2016-07-20 NOTE — ED Provider Notes (Signed)
[facility]-EMERGENCY DEPT Provider Note   CSN: [id number] Arrival date & time: 07/20/16  1457     History   Chief Complaint Chief Complaint  Patient presents with  . Abdominal Pain    HPI Daisy Harris is a 30 y.o. female.  Patient complains of lower abdominal pain. Patient has a history of a mesh placed in her abdomen for a hernia. Some nausea no vomiting    The history is provided by the patient. No language interpreter was used.  Abdominal Pain   This is a recurrent problem. The current episode started 2 days ago. The problem occurs constantly. The problem has not changed since onset.The pain is associated with an unknown factor. The pain is located in the suprapubic region. The quality of the pain is dull. The pain is at a severity of 4/10. The pain is moderate. Associated symptoms include anorexia. Pertinent negatives include diarrhea, frequency, hematuria and headaches.    Past Medical History:  Diagnosis Date  . Anxiety   . Asthma   . Insomnia   . Migraine     There are no active problems to display for this patient.   Past Surgical History:  Procedure Laterality Date  . ABDOMINAL HYSTERECTOMY    . ABDOMINAL SURGERY     bowel surgery  . ADENOIDECTOMY    . APPENDECTOMY    . CESAREAN SECTION    . CHOLECYSTECTOMY    . HERNIA REPAIR    . nissen fundiplication    . TONSILLECTOMY      OB History    Gravida Para Term Preterm AB Living   2 2 2          SAB TAB Ectopic Multiple Live Births                   Home Medications    Prior to Admission medications   Medication Sig Start Date End Date Taking? Authorizing Provider  doxycycline (VIBRAMYCIN) 100 MG capsule Take 1 capsule by mouth 2 (two) times daily. 07/18/16  Yes Historical Provider, MD  EPINEPHrine 0.3 mg/0.3 mL IJ SOAJ injection Inject 0.3 mLs (0.3 mg total) into the muscle once. 12/15/15  Yes Dione Booze, MD  albuterol (PROVENTIL HFA;VENTOLIN HFA) 108 (90 Base) MCG/ACT inhaler Inhale 2 puffs into  the lungs every 4 (four) hours as needed for wheezing or shortness of breath. 12/09/15   Samuel Jester, DO  estradiol (ESTRACE) 2 MG tablet Take 2 mg by mouth daily.    Historical Provider, MD  Ibuprofen (ADVIL MIGRAINE) 200 MG CAPS Take 1-2 capsules by mouth daily as needed (for pain).    Historical Provider, MD  levalbuterol (XOPENEX) 1.25 MG/0.5ML nebulizer solution Take 1.25 mg by nebulization every 4 (four) hours as needed for wheezing or shortness of breath.    Historical Provider, MD  ondansetron (ZOFRAN ODT) 4 MG disintegrating tablet 4mg  ODT q4 hours prn nausea/vomit 07/20/16   Bethann Berkshire, MD  oxyCODONE-acetaminophen (PERCOCET/ROXICET) 5-325 MG tablet Take 1 tablet by mouth every 6 (six) hours as needed. 07/20/16   Bethann Berkshire, MD  predniSONE (DELTASONE) 20 MG tablet Take 2 tablets (40 mg total) by mouth daily. 12/15/15   Dione Booze, MD    Family History Family History  Problem Relation Age of Onset  . Asthma Mother   . Asthma Father   . Asthma Other     Social History Social History  Substance Use Topics  . Smoking status: Never Smoker  . Smokeless tobacco: Never Used  . Alcohol  use No     Comment: occasional     Allergies   Amoxicillin; Azithromycin; Ceftin [cefuroxime axetil]; Ciprofloxacin; Hibiclens [chlorhexidine gluconate]; Peanuts [peanut oil]; Penicillins; and Latex   Review of Systems Review of Systems  Constitutional: Negative for appetite change and fatigue.  HENT: Negative for congestion, ear discharge and sinus pressure.   Eyes: Negative for discharge.  Respiratory: Negative for cough.   Cardiovascular: Negative for chest pain.  Gastrointestinal: Positive for abdominal pain and anorexia. Negative for diarrhea.  Genitourinary: Negative for frequency and hematuria.  Musculoskeletal: Negative for back pain.  Skin: Negative for rash.  Neurological: Negative for seizures and headaches.  Psychiatric/Behavioral: Negative for hallucinations.      Physical Exam Updated Vital Signs BP 136/78 (BP Location: Right Arm)   Pulse 76   Temp 98 F (36.7 C) (Oral)   Resp 17   Ht 5\' 3"  (1.6 m)   Wt 225 lb (102.1 kg)   SpO2 100%   BMI 39.86 kg/m   Physical Exam  Constitutional: She is oriented to person, place, and time. She appears well-developed.  HENT:  Head: Normocephalic.  Eyes: Conjunctivae and EOM are normal. No scleral icterus.  Neck: Neck supple. No thyromegaly present.  Cardiovascular: Normal rate and regular rhythm.  Exam reveals no gallop and no friction rub.   No murmur heard. Pulmonary/Chest: No stridor. She has no wheezes. She has no rales. She exhibits no tenderness.  Abdominal: She exhibits no distension. There is tenderness. There is no rebound.  Moderate tenderness to suprapubic  Musculoskeletal: Normal range of motion. She exhibits no edema.  Lymphadenopathy:    She has no cervical adenopathy.  Neurological: She is oriented to person, place, and time. She exhibits normal muscle tone. Coordination normal.  Skin: No rash noted. No erythema.  Psychiatric: She has a normal mood and affect. Her behavior is normal.     ED Treatments / Results  Labs (all labs ordered are listed, but only abnormal results are displayed) Labs Reviewed  COMPREHENSIVE METABOLIC PANEL - Abnormal; Notable for the following:       Result Value   Glucose, Bld 103 (*)    Total Protein 6.4 (*)    Anion gap 4 (*)    All other components within normal limits  URINALYSIS, ROUTINE W REFLEX MICROSCOPIC - Abnormal; Notable for the following:    APPearance HAZY (*)    All other components within normal limits  CBC WITH DIFFERENTIAL/PLATELET  LIPASE, BLOOD    EKG  EKG Interpretation None       Radiology Ct Abdomen Pelvis W Contrast  Result Date: 07/20/2016 CLINICAL DATA:  Patient with right-sided abdominal pain. EXAM: CT ABDOMEN AND PELVIS WITH CONTRAST TECHNIQUE: Multidetector CT imaging of the abdomen and pelvis was  performed using the standard protocol following bolus administration of intravenous contrast. CONTRAST:  100mL ISOVUE-300 IOPAMIDOL (ISOVUE-300) INJECTION 61% COMPARISON:  CT abdomen pelvis 03/20/2015. FINDINGS: Lower chest: Normal heart size. Lung bases are clear. Small hiatal hernia. Hepatobiliary: Liver is normal in size and contour. Within the left hepatic lobe there is a 12 mm low-attenuation lesion most compatible with a cyst (image 10; series 2). Gallbladder is surgically absent. Pancreas: Unremarkable Spleen: Unremarkable Adrenals/Urinary Tract: Adrenal glands are normal. Kidneys enhance symmetrically with contrast. Urinary bladder is unremarkable. Stomach/Bowel: No abnormal bowel wall thickening or evidence for bowel obstruction. Postsurgical changes involving the mid small bowel. Stool throughout the colon. Appendix is surgically absent. Vascular/Lymphatic: Normal caliber abdominal aorta. No retroperitoneal lymphadenopathy. Reproductive: Status  post hysterectomy. The ovaries appear located anterior to the psoas muscles bilaterally. Other: Postsurgical changes within the anterior abdominal wall which appear stable compared to prior exam. Nonspecific surgical clips within the left upper quadrant. Musculoskeletal: No aggressive or acute appearing osseous lesions. Lumbar spine degenerative changes. IMPRESSION: Stable appearing postsurgical changes anterior abdominal wall. Postsurgical changes involving the small bowel. No evidence for obstruction. Electronically Signed   By: Annia Beltrew  Davis M.D.   On: 07/20/2016 19:10    Procedures Procedures (including critical care time)  Medications Ordered in ED Medications  iopamidol (ISOVUE-300) 61 % injection (not administered)  HYDROmorphone (DILAUDID) injection 1 mg (1 mg Intravenous Given 07/20/16 1656)  ondansetron (ZOFRAN) injection 4 mg (4 mg Intravenous Given 07/20/16 1656)  iopamidol (ISOVUE-300) 61 % injection 100 mL (100 mLs Intravenous Contrast Given  07/20/16 1817)     Initial Impression / Assessment and Plan / ED Course  I have reviewed the triage vital signs and the nursing notes.  Pertinent labs & imaging results that were available during my care of the patient were reviewed by me and considered in my medical decision making (see chart for details).  Clinical Course     Lab work unremarkable. CT scan unremarkable. Lower abdominal pain could be related to scar tissue. Patient is given pain medicine nausea medicine will follow-up  Final Clinical Impressions(s) / ED Diagnoses   Final diagnoses:  Lower abdominal pain    New Prescriptions New Prescriptions   ONDANSETRON (ZOFRAN ODT) 4 MG DISINTEGRATING TABLET    4mg  ODT q4 hours prn nausea/vomit   OXYCODONE-ACETAMINOPHEN (PERCOCET/ROXICET) 5-325 MG TABLET    Take 1 tablet by mouth every 6 (six) hours as needed.     Bethann BerkshireJoseph Sheyla Zaffino, MD 07/20/16 2010

## 2016-07-21 ENCOUNTER — Emergency Department (HOSPITAL_COMMUNITY): Payer: Medicaid Other

## 2016-07-21 ENCOUNTER — Encounter (HOSPITAL_COMMUNITY): Payer: Self-pay | Admitting: *Deleted

## 2016-07-21 ENCOUNTER — Emergency Department (HOSPITAL_COMMUNITY)
Admission: EM | Admit: 2016-07-21 | Discharge: 2016-07-21 | Disposition: A | Discharge status: Home / Self Care | Payer: Medicaid Other | Attending: Physician Assistant | Admitting: Physician Assistant

## 2016-07-21 DIAGNOSIS — Z91040 Latex allergy status: Secondary | ICD-10-CM | POA: Diagnosis not present

## 2016-07-21 DIAGNOSIS — Z91010 Allergy to peanuts: Secondary | ICD-10-CM | POA: Diagnosis not present

## 2016-07-21 DIAGNOSIS — J45909 Unspecified asthma, uncomplicated: Secondary | ICD-10-CM | POA: Diagnosis not present

## 2016-07-21 DIAGNOSIS — R109 Unspecified abdominal pain: Secondary | ICD-10-CM | POA: Diagnosis present

## 2016-07-21 DIAGNOSIS — K529 Noninfective gastroenteritis and colitis, unspecified: Secondary | ICD-10-CM | POA: Diagnosis not present

## 2016-07-21 LAB — COMPREHENSIVE METABOLIC PANEL
ALT: 20 U/L (ref 14–54)
ANION GAP: 9 (ref 5–15)
AST: 22 U/L (ref 15–41)
Albumin: 3.5 g/dL (ref 3.5–5.0)
Alkaline Phosphatase: 93 U/L (ref 38–126)
BILIRUBIN TOTAL: 0.7 mg/dL (ref 0.3–1.2)
BUN: 11 mg/dL (ref 6–20)
CHLORIDE: 105 mmol/L (ref 101–111)
CO2: 25 mmol/L (ref 22–32)
Calcium: 9.4 mg/dL (ref 8.9–10.3)
Creatinine, Ser: 0.8 mg/dL (ref 0.44–1.00)
Glucose, Bld: 74 mg/dL (ref 65–99)
POTASSIUM: 3.9 mmol/L (ref 3.5–5.1)
Sodium: 139 mmol/L (ref 135–145)
TOTAL PROTEIN: 6.4 g/dL — AB (ref 6.5–8.1)

## 2016-07-21 LAB — CBC
HEMATOCRIT: 42.2 % (ref 36.0–46.0)
HEMOGLOBIN: 13.5 g/dL (ref 12.0–15.0)
MCH: 27.6 pg (ref 26.0–34.0)
MCHC: 32 g/dL (ref 30.0–36.0)
MCV: 86.1 fL (ref 78.0–100.0)
Platelets: 240 10*3/uL (ref 150–400)
RBC: 4.9 MIL/uL (ref 3.87–5.11)
RDW: 14.5 % (ref 11.5–15.5)
WBC: 4.8 10*3/uL (ref 4.0–10.5)

## 2016-07-21 LAB — I-STAT BETA HCG BLOOD, ED (MC, WL, AP ONLY): I-stat hCG, quantitative: <5 m[IU]/mL

## 2016-07-21 LAB — LIPASE, BLOOD: LIPASE: 17 U/L (ref 11–51)

## 2016-07-21 MED ORDER — IOPAMIDOL (ISOVUE-300) INJECTION 61%
INTRAVENOUS | Status: AC
  Administered 2016-07-21: 100 mL
  Filled 2016-07-21: qty 100

## 2016-07-21 MED ORDER — OXYCODONE-ACETAMINOPHEN 5-325 MG PO TABS
1.0000 | ORAL_TABLET | Freq: Once | ORAL | Status: AC
  Administered 2016-07-21: 1 via ORAL
  Filled 2016-07-21: qty 1

## 2016-07-21 MED ORDER — FENTANYL CITRATE (PF) 100 MCG/2ML IJ SOLN
50.0000 ug | Freq: Once | INTRAMUSCULAR | Status: AC
  Administered 2016-07-21: 50 ug via INTRAVENOUS
  Filled 2016-07-21: qty 2

## 2016-07-21 MED ORDER — SODIUM CHLORIDE 0.9 % IV BOLUS (SEPSIS)
1000.0000 mL | Freq: Once | INTRAVENOUS | Status: AC
  Administered 2016-07-21: 1000 mL via INTRAVENOUS

## 2016-07-21 MED ORDER — ONDANSETRON HCL 4 MG PO TABS: 4.0000 mg | ORAL_TABLET | Freq: Three times a day (TID) | ORAL | 0 refills | Status: DC | PRN

## 2016-07-21 MED ORDER — ONDANSETRON HCL 4 MG/2ML IJ SOLN
4.0000 mg | Freq: Once | INTRAMUSCULAR | Status: AC
  Administered 2016-07-21: 4 mg via INTRAVENOUS
  Filled 2016-07-21: qty 2

## 2016-07-21 NOTE — ED Provider Notes (Signed)
[facility]-EMERGENCY DEPT Provider Note   CSN: [id number] Arrival date & time: 07/21/16  1402     History   Chief Complaint Chief Complaint  Patient presents with  . Abdominal Pain    HPI Daisy Harris is a 30 y.o. female.  HPI   Patient is a 30 year old female with history of anxiety and insomnia migraine and intra-abdominal hernia. She is presenting with abdominal pain. Patient percent with the same thing to [facility] yesterday. She had a negative CT. Patient reports that her pain got much worse overnight that she feels that she has bulging in her anterior abdominal wall. No nausea or vomiting.   Past Medical History:  Diagnosis Date  . Anxiety   . Asthma   . Insomnia   . Migraine     There are no active problems to display for this patient.   Past Surgical History:  Procedure Laterality Date  . ABDOMINAL HYSTERECTOMY    . ABDOMINAL SURGERY     bowel surgery  . ADENOIDECTOMY    . APPENDECTOMY    . CESAREAN SECTION    . CHOLECYSTECTOMY    . HERNIA REPAIR    . nissen fundiplication    . TONSILLECTOMY      OB History    Gravida Para Term Preterm AB Living   2 2 2          SAB TAB Ectopic Multiple Live Births                   Home Medications    Prior to Admission medications   Medication Sig Start Date End Date Taking? Authorizing Provider  albuterol (PROVENTIL HFA;VENTOLIN HFA) 108 (90 Base) MCG/ACT inhaler Inhale 2 puffs into the lungs every 4 (four) hours as needed for wheezing or shortness of breath. 12/09/15   Samuel Jester, DO  doxycycline (VIBRAMYCIN) 100 MG capsule Take 1 capsule by mouth 2 (two) times daily. 07/18/16   Historical Provider, MD  EPINEPHrine 0.3 mg/0.3 mL IJ SOAJ injection Inject 0.3 mLs (0.3 mg total) into the muscle once. 12/15/15   Dione Booze, MD  estradiol (ESTRACE) 2 MG tablet Take 2 mg by mouth daily.    Historical Provider, MD  Ibuprofen (ADVIL MIGRAINE) 200 MG CAPS Take 1-2 capsules by mouth daily as needed (for pain).     Historical Provider, MD  levalbuterol (XOPENEX) 1.25 MG/0.5ML nebulizer solution Take 1.25 mg by nebulization every 4 (four) hours as needed for wheezing or shortness of breath.    Historical Provider, MD  ondansetron (ZOFRAN ODT) 4 MG disintegrating tablet 4mg  ODT q4 hours prn nausea/vomit 07/20/16   Bethann Berkshire, MD  ondansetron (ZOFRAN) 4 MG tablet Take 1 tablet (4 mg total) by mouth every 8 (eight) hours as needed for nausea or vomiting. 07/21/16   Quadry Kampa Lyn Lacara Dunsworth, MD  oxyCODONE-acetaminophen (PERCOCET/ROXICET) 5-325 MG tablet Take 1 tablet by mouth every 6 (six) hours as needed. 07/20/16   Bethann Berkshire, MD  predniSONE (DELTASONE) 20 MG tablet Take 2 tablets (40 mg total) by mouth daily. 12/15/15   Dione Booze, MD    Family History Family History  Problem Relation Age of Onset  . Asthma Mother   . Asthma Father   . Asthma Other     Social History Social History  Substance Use Topics  . Smoking status: Never Smoker  . Smokeless tobacco: Never Used  . Alcohol use No     Comment: occasional     Allergies   Amoxicillin; Azithromycin; Ceftin [cefuroxime  axetil]; Ciprofloxacin; Hibiclens [chlorhexidine gluconate]; Peanuts [peanut oil]; Penicillins; and Latex   Review of Systems Review of Systems  Constitutional: Negative for fatigue and fever.  Gastrointestinal: Positive for abdominal pain. Negative for diarrhea, nausea and vomiting.  Neurological: Negative for weakness.  All other systems reviewed and are negative.    Physical Exam Updated Vital Signs BP 128/65 (BP Location: Left Arm)   Pulse 88   Temp 97.3 F (36.3 C) (Oral)   Resp 18   Ht 5\' 4"  (1.626 m)   Wt 200 lb (90.7 kg)   SpO2 100%   BMI 34.33 kg/m   Physical Exam  Constitutional: She is oriented to person, place, and time. She appears well-developed and well-nourished.  HENT:  Head: Normocephalic and atraumatic.  Eyes: Right eye exhibits no discharge.  Cardiovascular: Normal rate, regular rhythm  and normal heart sounds.   No murmur heard. Pulmonary/Chest: Effort normal and breath sounds normal. She has no wheezes. She has no rales.  Abdominal: Soft. There is tenderness.  Mild outpouching, reducible, very mild pain with palpation deeply.  Neurological: She is oriented to person, place, and time.  Skin: Skin is warm and dry. She is not diaphoretic.  Psychiatric: She has a normal mood and affect.  Nursing note and vitals reviewed.    ED Treatments / Results  Labs (all labs ordered are listed, but only abnormal results are displayed) Labs Reviewed  COMPREHENSIVE METABOLIC PANEL - Abnormal; Notable for the following:       Result Value   Total Protein 6.4 (*)    All other components within normal limits  LIPASE, BLOOD  CBC  I-STAT BETA HCG BLOOD, ED ([facility], [facility], [facility] ONLY)    EKG  EKG Interpretation None       Radiology Ct Abdomen Pelvis W Contrast  Result Date: 07/21/2016 CLINICAL DATA:  Periumbilical abdomen pain a few days ago. Patient has emergency hernia repair in September 2016 with similar pain. EXAM: CT ABDOMEN AND PELVIS WITH CONTRAST TECHNIQUE: Multidetector CT imaging of the abdomen and pelvis was performed using the standard protocol following bolus administration of intravenous contrast. CONTRAST:  100mL ISOVUE-300 IOPAMIDOL (ISOVUE-300) INJECTION 61% COMPARISON:  July 20, 2016 FINDINGS: Lower chest: No acute abnormality. Hepatobiliary: 1.2 cm cyst is identified in the left lobe liver. The liver is otherwise normal. Patient status post prior cholecystectomy. Pancreas: Unremarkable. No pancreatic ductal dilatation or surrounding inflammatory changes. Spleen: Normal in size without focal abnormality. Adrenals/Urinary Tract: Adrenal glands are unremarkable. Kidneys are normal, without renal calculi, focal lesion, or hydronephrosis. Bladder is unremarkable. Stomach/Bowel: Abnormal thick wall colonic loops are identified in the transverse colon. There is no small bowel  obstruction the appendix is surgically absent. Vascular/Lymphatic: No significant vascular findings are present. No enlarged abdominal or pelvic lymph nodes. Reproductive: Status post hysterectomy. No adnexal masses. Other: Stable postsurgical changes of anterior abdominal wall are unchanged. Musculoskeletal: No acute or significant osseous findings. IMPRESSION: Abnormal thick wall transverse colon consistent with colitis. Stable postsurgical changes of anterior abdominal wall without acute abnormality. Electronically Signed   By: Sherian ReinWei-Chen  Lin M.D.   On: 07/21/2016 17:52   Ct Abdomen Pelvis W Contrast  Result Date: 07/20/2016 CLINICAL DATA:  Patient with right-sided abdominal pain. EXAM: CT ABDOMEN AND PELVIS WITH CONTRAST TECHNIQUE: Multidetector CT imaging of the abdomen and pelvis was performed using the standard protocol following bolus administration of intravenous contrast. CONTRAST:  100mL ISOVUE-300 IOPAMIDOL (ISOVUE-300) INJECTION 61% COMPARISON:  CT abdomen pelvis 03/20/2015. FINDINGS: Lower chest: Normal heart  size. Lung bases are clear. Small hiatal hernia. Hepatobiliary: Liver is normal in size and contour. Within the left hepatic lobe there is a 12 mm low-attenuation lesion most compatible with a cyst (image 10; series 2). Gallbladder is surgically absent. Pancreas: Unremarkable Spleen: Unremarkable Adrenals/Urinary Tract: Adrenal glands are normal. Kidneys enhance symmetrically with contrast. Urinary bladder is unremarkable. Stomach/Bowel: No abnormal bowel wall thickening or evidence for bowel obstruction. Postsurgical changes involving the mid small bowel. Stool throughout the colon. Appendix is surgically absent. Vascular/Lymphatic: Normal caliber abdominal aorta. No retroperitoneal lymphadenopathy. Reproductive: Status post hysterectomy. The ovaries appear located anterior to the psoas muscles bilaterally. Other: Postsurgical changes within the anterior abdominal wall which appear stable  compared to prior exam. Nonspecific surgical clips within the left upper quadrant. Musculoskeletal: No aggressive or acute appearing osseous lesions. Lumbar spine degenerative changes. IMPRESSION: Stable appearing postsurgical changes anterior abdominal wall. Postsurgical changes involving the small bowel. No evidence for obstruction. Electronically Signed   By: Annia Beltrew  Davis M.D.   On: 07/20/2016 19:10    Procedures Procedures (including critical care time)  Medications Ordered in ED Medications  fentaNYL (SUBLIMAZE) injection 50 mcg (50 mcg Intravenous Given 07/21/16 1631)  sodium chloride 0.9 % bolus 1,000 mL (0 mLs Intravenous Stopped 07/21/16 1743)  iopamidol (ISOVUE-300) 61 % injection (100 mLs  Contrast Given 07/21/16 1731)  ondansetron (ZOFRAN) injection 4 mg (4 mg Intravenous Given 07/21/16 1751)  oxyCODONE-acetaminophen (PERCOCET/ROXICET) 5-325 MG per tablet 1 tablet (1 tablet Oral Given 07/21/16 1903)     Initial Impression / Assessment and Plan / ED Course  I have reviewed the triage vital signs and the nursing notes.  Pertinent labs & imaging results that were available during my care of the patient were reviewed by me and considered in my medical decision making (see chart for details).  Clinical Course     30 year old female presenting with worsening abdominal pain in the last 24 hours. Vision negative CAT scan yesterday. She is reporting that the CAT scan has gotten much worse. She states that she had a negative CAT scan previously and then required surgery next day. She is concerned that she has a "tear in her hernia". Patient does have mild outpouching however no evidence of incarcerated or nonreducible bowel.  No vomiting. No fever. Patient has a really benign exam. However given her symptoms and her tenderness on exam will get additional CAT scan. We discussed risks benefits of additional radiation and patient would really like a CAT scan at this time.   CT shows  colitis.Likely virus.  Will give return precuations, patietn has percocet and zofran at home. GI follow up given and return precuations expressed.  Patient is comfortable, ambulatory, and taking PO at time of discharge.  Patient expressed understanding about return precautions.    Final Clinical Impressions(s) / ED Diagnoses   Final diagnoses:  Colitis    New Prescriptions Discharge Medication List as of 07/21/2016  7:16 PM    START taking these medications   Details  ondansetron (ZOFRAN) 4 MG tablet Take 1 tablet (4 mg total) by mouth every 8 (eight) hours as needed for nausea or vomiting., Starting Sun 07/21/2016, Print         Kylan Veach Randall AnLyn Endora Teresi, MD 07/21/16 2341

## 2016-07-21 NOTE — Discharge Instructions (Signed)
You have inflammation of your colon. It is likely this is from a virus. However get worse he may need to return to the hospital or your primary care physician and get antibiotics. Also need to follow up with gastroenterology. They may need to do a scope to look at the inflammation in your colon.

## 2016-07-21 NOTE — ED Notes (Signed)
Pt returned from CT, c/o nausea 

## 2016-07-21 NOTE — ED Triage Notes (Signed)
Pt has hx of hernia repair that was torn and pt had emergency surgery. Pt having increase in mid abd pain for several days, reports this pain is similar to when it was torn in past. Denies n/v/d. Was seen at [facility] last night for same and had ct scan done.

## 2016-07-23 ENCOUNTER — Encounter (INDEPENDENT_AMBULATORY_CARE_PROVIDER_SITE_OTHER): Payer: Self-pay | Admitting: Internal Medicine

## 2016-07-31 ENCOUNTER — Encounter (INDEPENDENT_AMBULATORY_CARE_PROVIDER_SITE_OTHER): Payer: Self-pay | Admitting: Internal Medicine

## 2016-07-31 ENCOUNTER — Ambulatory Visit (INDEPENDENT_AMBULATORY_CARE_PROVIDER_SITE_OTHER): Payer: Medicaid Other | Admitting: Internal Medicine

## 2016-09-05 IMAGING — CT CT ABD-PELV W/O CM
2 of 4 series · 16 of 46 positions shown, 18 images · non-contrast
Comparison: None currently available

CLINICAL DATA: Abdominal pain. History of multiple abdominal
surgeries

EXAM:
CT ABDOMEN AND PELVIS WITHOUT CONTRAST
TECHNIQUE: Multidetector CT imaging of the abdomen and pelvis was performed
following the standard protocol without IV contrast.

[Series 2: abdomen/pelvis w/o contrast · axial · non-contrast · 0.66mm/px · z∈[+721,+1086]mm · 13 of 85 slices shown, 15 images]
[im 6/85  soft-tissue]
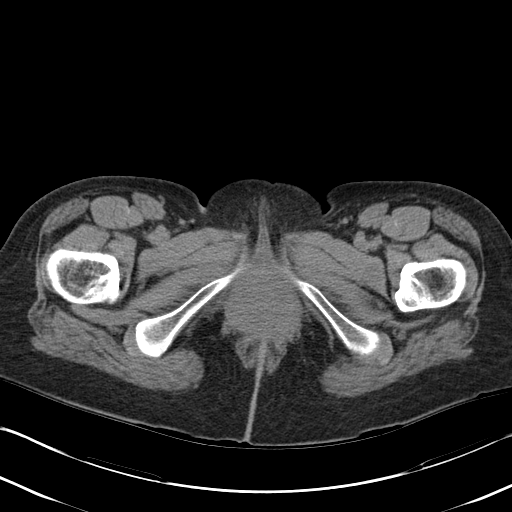
[im 6/85  bone]
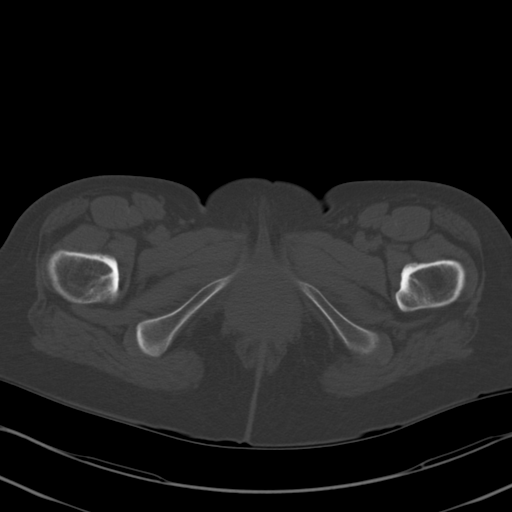
[im 12/85  soft-tissue]
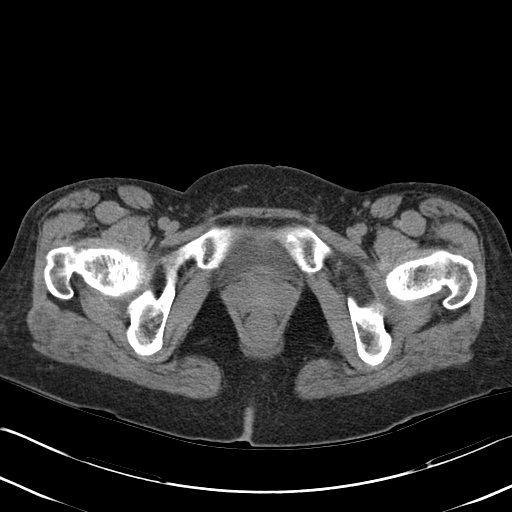
[im 17/85  soft-tissue]
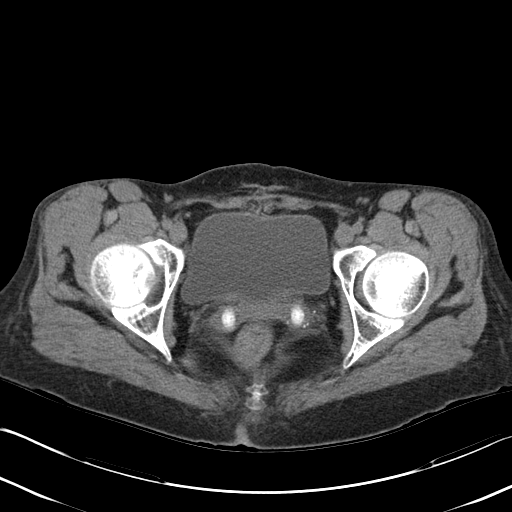
[im 23/85  soft-tissue]
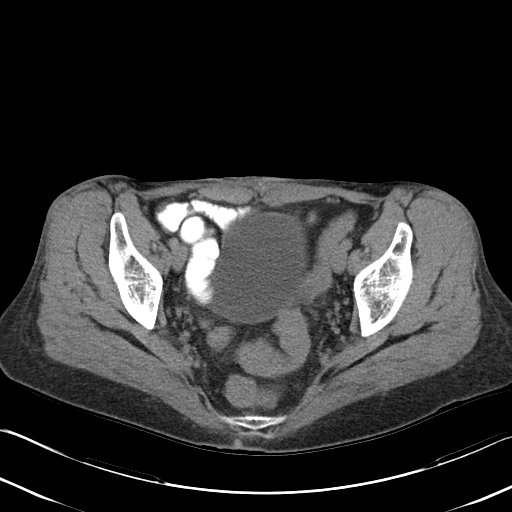
[im 29/85  soft-tissue]
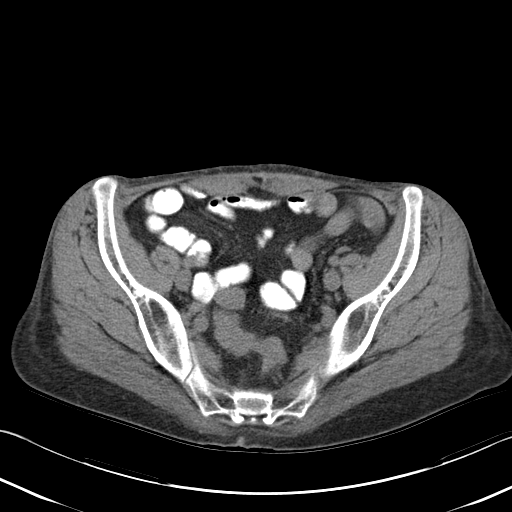
[im 34/85  soft-tissue]
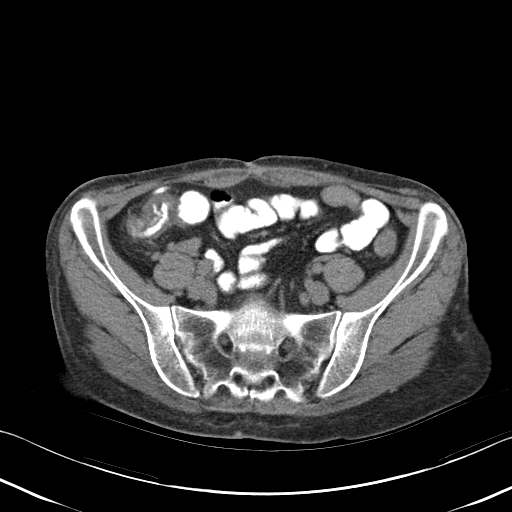
[im 45/85  soft-tissue]
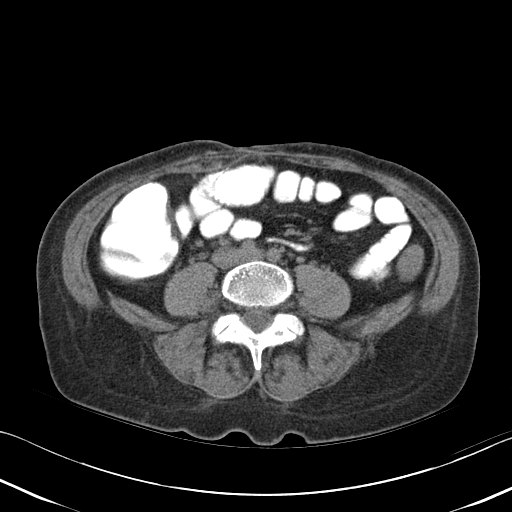
[im 51/85  soft-tissue]
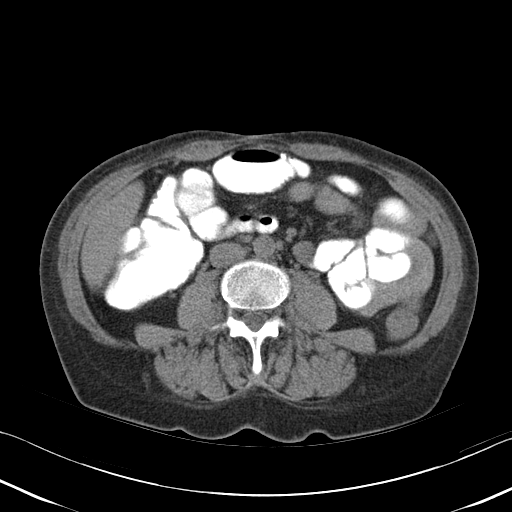
[im 57/85  soft-tissue]
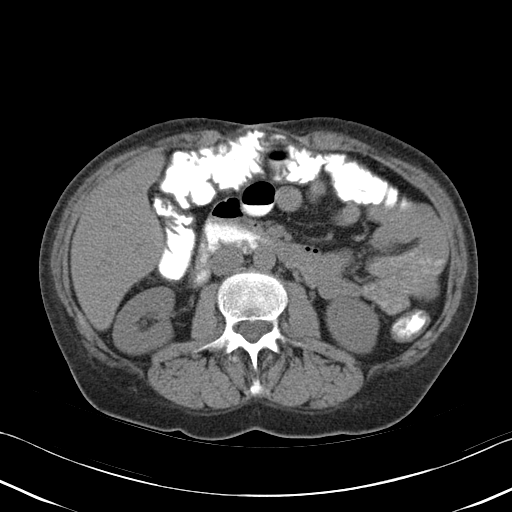
[im 57/85  bone]
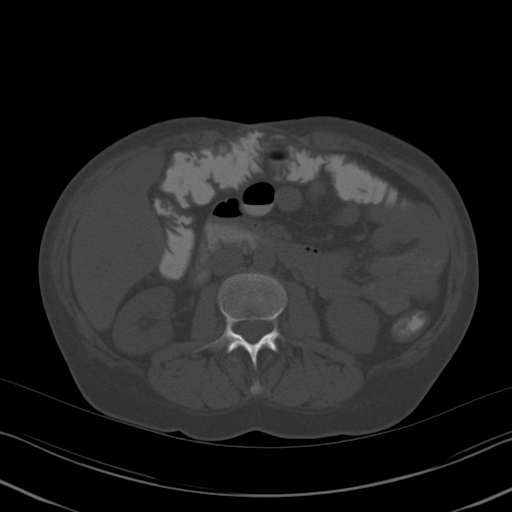
[im 62/85  soft-tissue]
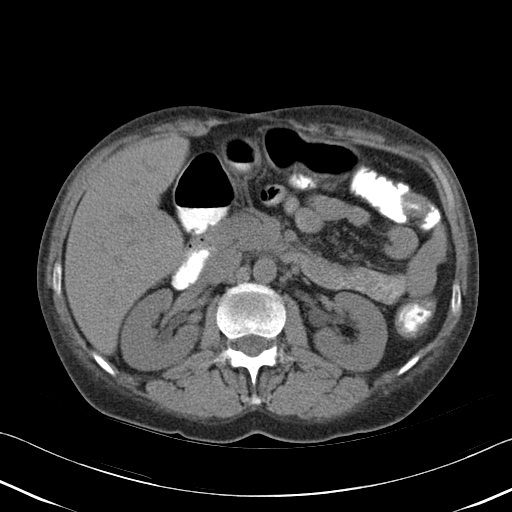
[im 68/85  soft-tissue]
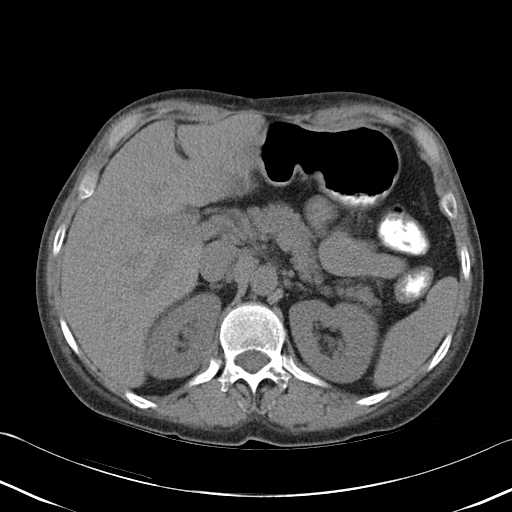
[im 73/85  soft-tissue]
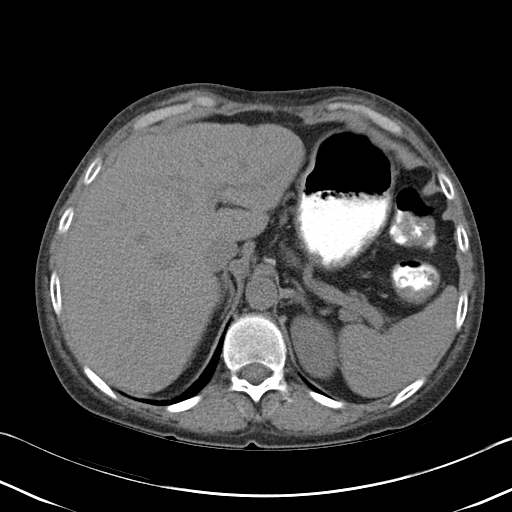
[im 79/85  soft-tissue]
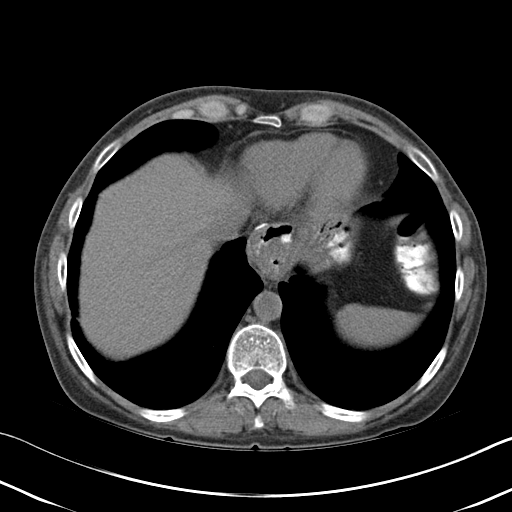

[Series 3: mpr cor 3.0mm · coronal · 0.67mm/px · 3 of 77 slices shown]
[im 26/77  soft-tissue]
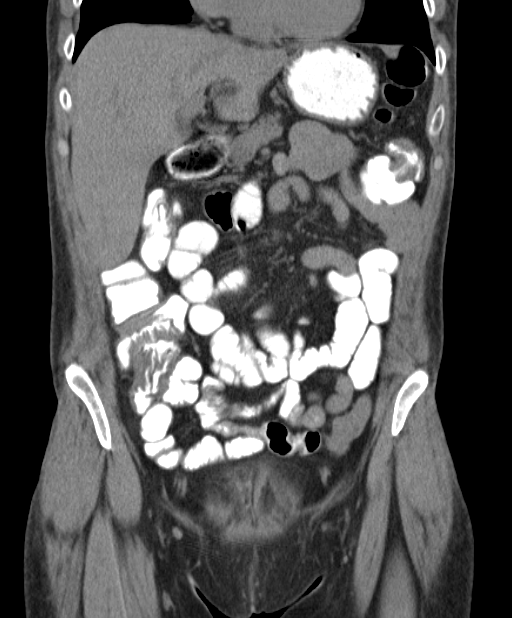
[im 34/77  soft-tissue]
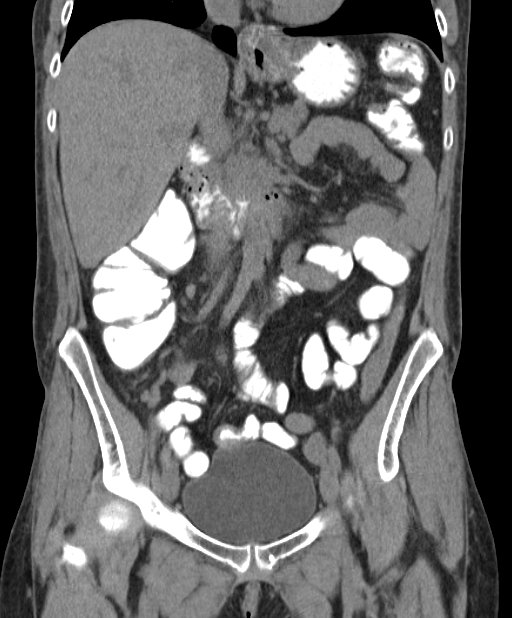
[im 43/77  soft-tissue]
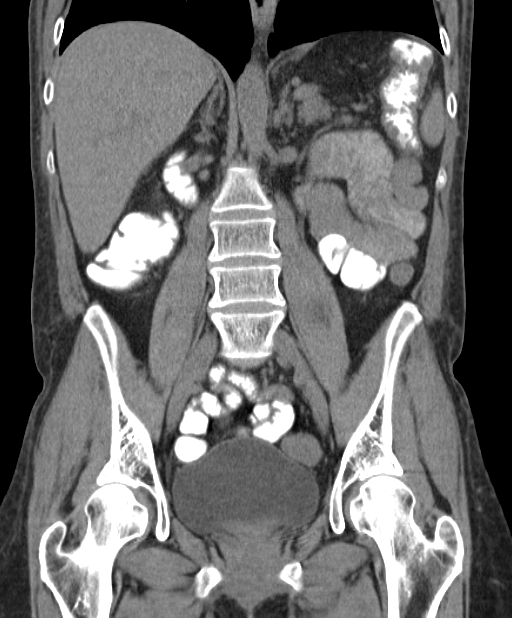

[16 of 46 positions shown; findings below may reference images not displayed]

FINDINGS: BODY WALL: Diffuse abdominal wall scarring and distortion.

LOWER CHEST: Small hiatal hernia with wrapped appearance compatible
with history of Nissen fundoplication.

ABDOMEN/PELVIS:

Liver: No focal abnormality.

Biliary: Cholecystectomy.  No common bile duct enlargement.

Pancreas: Unremarkable.

Spleen: Unremarkable.

Adrenals: Unremarkable.

Kidneys and ureters: No hydronephrosis or stone.

Bladder: Unremarkable.

Reproductive: Hysterectomy and probable oophorectomies. Vaginal ring
noted.

Bowel: Thickened appearance of the distal colon which is likely from
underdistention. No surrounding inflammatory changes. No bowel
obstruction. No appendicitis.

Retroperitoneum: No mass or adenopathy.

Peritoneum: No ascites or pneumoperitoneum.

Vascular: No acute abnormality.

OSSEOUS: No acute abnormalities.
IMPRESSION: 1. No acute finding.
2. Small hiatal hernia including Nissen fundoplication.

## 2016-12-28 ENCOUNTER — Encounter (HOSPITAL_COMMUNITY): Payer: Self-pay

## 2016-12-28 ENCOUNTER — Emergency Department (HOSPITAL_COMMUNITY)
Admission: EM | Admit: 2016-12-28 | Discharge: 2016-12-28 | Disposition: A | Discharge status: Home / Self Care | Payer: Medicaid Other | Attending: Emergency Medicine | Admitting: Emergency Medicine

## 2016-12-28 DIAGNOSIS — H6501 Acute serous otitis media, right ear: Secondary | ICD-10-CM | POA: Diagnosis not present

## 2016-12-28 DIAGNOSIS — J45909 Unspecified asthma, uncomplicated: Secondary | ICD-10-CM | POA: Diagnosis not present

## 2016-12-28 DIAGNOSIS — H6091 Unspecified otitis externa, right ear: Secondary | ICD-10-CM | POA: Diagnosis not present

## 2016-12-28 DIAGNOSIS — H9201 Otalgia, right ear: Secondary | ICD-10-CM | POA: Diagnosis present

## 2016-12-28 MED ORDER — ONDANSETRON HCL 4 MG PO TABS
4.0000 mg | ORAL_TABLET | Freq: Once | ORAL | Status: AC
  Administered 2016-12-28: 4 mg via ORAL
  Filled 2016-12-28: qty 1

## 2016-12-28 MED ORDER — LORATADINE-PSEUDOEPHEDRINE ER 5-120 MG PO TB12: 1.0000 | ORAL_TABLET | Freq: Two times a day (BID) | ORAL | 0 refills | Status: DC

## 2016-12-28 MED ORDER — IBUPROFEN 800 MG PO TABS
800.0000 mg | ORAL_TABLET | Freq: Once | ORAL | Status: AC
  Administered 2016-12-28: 800 mg via ORAL
  Filled 2016-12-28: qty 1

## 2016-12-28 MED ORDER — IBUPROFEN 600 MG PO TABS: 600.0000 mg | ORAL_TABLET | Freq: Four times a day (QID) | ORAL | 0 refills | Status: DC

## 2016-12-28 MED ORDER — TRAMADOL HCL 50 MG PO TABS: 50.0000 mg | ORAL_TABLET | Freq: Four times a day (QID) | ORAL | 0 refills | Status: DC | PRN

## 2016-12-28 MED ORDER — LEVOFLOXACIN 500 MG PO TABS: 500.0000 mg | ORAL_TABLET | Freq: Every day | ORAL | 0 refills | Status: DC

## 2016-12-28 MED ORDER — NEOMYCIN-POLYMYXIN-HC 1 % OT SOLN
4.0000 [drp] | Freq: Once | OTIC | Status: AC
  Administered 2016-12-28: 4 [drp] via OTIC
  Filled 2016-12-28: qty 10

## 2016-12-28 MED ORDER — ACETAMINOPHEN 500 MG PO TABS
1000.0000 mg | ORAL_TABLET | Freq: Once | ORAL | Status: AC
  Administered 2016-12-28: 1000 mg via ORAL
  Filled 2016-12-28: qty 2

## 2016-12-28 MED ORDER — LEVOFLOXACIN 500 MG PO TABS
500.0000 mg | ORAL_TABLET | Freq: Once | ORAL | Status: AC
  Administered 2016-12-28: 500 mg via ORAL
  Filled 2016-12-28: qty 1

## 2016-12-28 NOTE — ED Notes (Signed)
HB in to assess 

## 2016-12-28 NOTE — ED Triage Notes (Signed)
Right ear pain that started today at 1230 after swimming in a  Pool.

## 2016-12-28 NOTE — ED Provider Notes (Signed)
[facility] DEPT Provider Note   CSN: [id number] Arrival date & time: 12/28/16  1552     History   Chief Complaint Chief Complaint  Patient presents with  . Otalgia    HPI Daisy Harris is a 31 y.o. female.  Patient is a 31 year old female presents to the emergency department with a complaint of right ear pain.  The patient states that she has had multiple problems with her years, particularly the right for several years. She has had operations. She states today she does dental poor, felt a sharp pain in her right ear. She states the pain continues on at this point. She states that she tries to avoid deep diving, but this was a shallow dive but still gave her pain and headache. She presents to the emergency department now for assistance with this issue.      Past Medical History:  Diagnosis Date  . Anxiety   . Asthma   . Insomnia   . Migraine     There are no active problems to display for this patient.   Past Surgical History:  Procedure Laterality Date  . ABDOMINAL HYSTERECTOMY    . ABDOMINAL SURGERY     bowel surgery  . ADENOIDECTOMY    . APPENDECTOMY    . CESAREAN SECTION    . CHOLECYSTECTOMY    . HERNIA REPAIR    . nissen fundiplication    . TONSILLECTOMY      OB History    Gravida Para Term Preterm AB Living   2 2 2          SAB TAB Ectopic Multiple Live Births                   Home Medications    Prior to Admission medications   Medication Sig Start Date End Date Taking? Authorizing Provider  albuterol (PROVENTIL HFA;VENTOLIN HFA) 108 (90 Base) MCG/ACT inhaler Inhale 2 puffs into the lungs every 4 (four) hours as needed for wheezing or shortness of breath. 12/09/15   Samuel JesterMcManus, Kathleen, DO  doxycycline (VIBRAMYCIN) 100 MG capsule Take 1 capsule by mouth 2 (two) times daily. 07/18/16   [provider]  EPINEPHrine 0.3 mg/0.3 mL IJ SOAJ injection Inject 0.3 mLs (0.3 mg total) into the muscle once. 12/15/15   Dione BoozeGlick, David, MD    estradiol (ESTRACE) 2 MG tablet Take 2 mg by mouth daily.    [provider]  Ibuprofen (ADVIL MIGRAINE) 200 MG CAPS Take 1-2 capsules by mouth daily as needed (for pain).    [provider]  levalbuterol (XOPENEX) 1.25 MG/0.5ML nebulizer solution Take 1.25 mg by nebulization every 4 (four) hours as needed for wheezing or shortness of breath.    [provider]  ondansetron (ZOFRAN ODT) 4 MG disintegrating tablet 4mg  ODT q4 hours prn nausea/vomit 07/20/16   Bethann BerkshireZammit, Joseph, MD  ondansetron (ZOFRAN) 4 MG tablet Take 1 tablet (4 mg total) by mouth every 8 (eight) hours as needed for nausea or vomiting. 07/21/16   Mackuen, Courteney Lyn, MD  oxyCODONE-acetaminophen (PERCOCET/ROXICET) 5-325 MG tablet Take 1 tablet by mouth every 6 (six) hours as needed. 07/20/16   Bethann BerkshireZammit, Joseph, MD  predniSONE (DELTASONE) 20 MG tablet Take 2 tablets (40 mg total) by mouth daily. 12/15/15   Dione BoozeGlick, David, MD    Family History Family History  Problem Relation Age of Onset  . Asthma Mother   . Asthma Father   . Asthma Other     Social History Social History  Substance Use Topics  . Smoking status: Never Smoker  . Smokeless tobacco: Never Used  . Alcohol use No     Comment: occasional     Allergies   Amoxicillin; Azithromycin; Ceftin [cefuroxime axetil]; Ciprofloxacin; Hibiclens [chlorhexidine gluconate]; Peanuts [peanut oil]; Penicillins; and Latex   Review of Systems Review of Systems  Constitutional: Negative for activity change and appetite change.  HENT: Positive for ear pain. Negative for congestion, ear discharge, facial swelling, nosebleeds, rhinorrhea, sneezing and tinnitus.   Eyes: Negative for photophobia, pain and discharge.  Respiratory: Negative for cough, choking, shortness of breath and wheezing.   Cardiovascular: Negative for chest pain, palpitations and leg swelling.  Gastrointestinal: Negative for abdominal pain, blood in stool, constipation, diarrhea,  nausea and vomiting.  Genitourinary: Negative for difficulty urinating, dysuria, flank pain, frequency and hematuria.  Musculoskeletal: Negative for back pain, gait problem, myalgias and neck pain.  Skin: Negative for color change, rash and wound.  Neurological: Negative for dizziness, seizures, syncope, facial asymmetry, speech difficulty, weakness and numbness.  Hematological: Negative for adenopathy. Does not bruise/bleed easily.  Psychiatric/Behavioral: Negative for agitation, confusion, hallucinations, self-injury and suicidal ideas. The patient is not nervous/anxious.      Physical Exam Updated Vital Signs BP 126/79 (BP Location: Right Arm)   Pulse 91   Temp 98.3 F (36.8 C) (Oral)   Resp 17   Ht 5\' 3"  (1.6 m)   Wt 111.1 kg (245 lb)   SpO2 100%   BMI 43.40 kg/m   Physical Exam  Constitutional: Vital signs are normal. She appears well-developed and well-nourished. She is active.  HENT:  Head: Normocephalic and atraumatic.  Right Ear: Tympanic membrane, external ear and ear canal normal.  Left Ear: Tympanic membrane, external ear and ear canal normal.  Nose: Nose normal.  Mouth/Throat: Uvula is midline, oropharynx is clear and moist and mucous membranes are normal.  There are no pre-or postauricular nodes appreciated on examination. There is some increased redness at the 9:00 position near the tympanic membrane on the right. There is some bulging of the tympanic membrane. No foreign body appreciated.  There is nasal congestion present.  Eyes: Conjunctivae, EOM and lids are normal. Pupils are equal, round, and reactive to light.  Neck: Trachea normal, normal range of motion and phonation normal. Neck supple. Carotid bruit is not present.  Cardiovascular: Normal rate, regular rhythm and normal pulses.   Abdominal: Soft. Normal appearance and bowel sounds are normal.  Lymphadenopathy:       Head (right side): No submental, no preauricular and no posterior auricular adenopathy  present.       Head (left side): No submental, no preauricular and no posterior auricular adenopathy present.    She has no cervical adenopathy.  Neurological: She is alert. She has normal strength. No cranial nerve deficit or sensory deficit. GCS eye subscore is 4. GCS verbal subscore is 5. GCS motor subscore is 6.  Skin: Skin is warm and dry.  Psychiatric: Her speech is normal.     ED Treatments / Results  Labs (all labs ordered are listed, but only abnormal results are displayed) Labs Reviewed - No data to display  EKG  EKG Interpretation None       Radiology No results found.  Procedures Procedures (including critical care time)  Medications Ordered in ED Medications - No data to display   Initial Impression / Assessment and Plan / ED Course  I have reviewed the triage vital signs and the nursing notes.  Pertinent  labs & imaging results that were available during my care of the patient were reviewed by me and considered in my medical decision making (see chart for details).       Final Clinical Impressions(s) / ED Diagnoses MDM Patient has a history of ear problems. Today she dove into a shallow area of the portal, and had a very sharp pain in the right ear. The pain continues. The patient will be treated with Cortisporin otic, Levaquin, ibuprofen, and Ultram. The patient has multiple antibiotic allergies, but states that she has taken Levaquin in the past on more than one occasion and has not had any problems with the Levaquin. The patient is encouraged to see Dr. Suszanne Conners for ear nose and throat evaluation if not improving.    Final diagnoses:  None    New Prescriptions New Prescriptions   No medications on file     Daisy Harris, Cordelia Poche 12/28/16 1703    Donnetta Hutching, MD 12/29/16 [phone]

## 2016-12-28 NOTE — Discharge Instructions (Signed)
Please refrain from swimming over the next 5-7 days. Use 4 drops of Cortisporin to the right ear 3 times daily. Use Claritin-D every 12 hours. Use Levaquin daily with food. Ibuprofen every 6 hours or 4 times a day with breakfast, lunch, dinner, and at bedtime. May use Ultram for more severe pain. This medication may cause drowsiness, please do not drive, operate machinery, drink alcohol, or participated in activities quite concentration when taking this medication. Please see Dr.Teoh for ear nose and throat evaluation if not improving.

## 2017-01-04 ENCOUNTER — Encounter (HOSPITAL_COMMUNITY): Payer: Self-pay | Admitting: Emergency Medicine

## 2017-01-04 ENCOUNTER — Emergency Department (HOSPITAL_COMMUNITY)
Admission: EM | Admit: 2017-01-04 | Discharge: 2017-01-04 | Disposition: A | Discharge status: Home Health | Payer: Medicaid Other | Attending: Emergency Medicine | Admitting: Emergency Medicine

## 2017-01-04 DIAGNOSIS — J45909 Unspecified asthma, uncomplicated: Secondary | ICD-10-CM | POA: Diagnosis not present

## 2017-01-04 DIAGNOSIS — H60391 Other infective otitis externa, right ear: Secondary | ICD-10-CM | POA: Insufficient documentation

## 2017-01-04 DIAGNOSIS — H9201 Otalgia, right ear: Secondary | ICD-10-CM | POA: Diagnosis present

## 2017-01-04 MED ORDER — NEOMYCIN-POLYMYXIN-HC 3.5-10000-1 OT SOLN: 3.0000 [drp] | Freq: Four times a day (QID) | OTIC | 0 refills | Status: AC

## 2017-01-04 NOTE — ED Provider Notes (Signed)
[facility] Provider Note   CSN: [id number] Arrival date & time: 01/04/17  1044   History   Chief Complaint Chief Complaint  Patient presents with  . Otalgia    HPI Daisy Harris is a 31 y.o. female.  Patient reports that she started having right ear pain, fullness, and decreased hearing this am.  She reports recently being seen 6/10 for same.  She reports she was treated with Levaquin, Tramadol and ear drop, which did help.  She reports she saw her PCP last week and was told the ear looked good.  She denies recent swimming.  No fevers, nausea, vomiting.  She reports mild headache that has been present since start of ear pain.  She denies dizziness, visual disturbance, neck pain, rash.  She sees Dr Ferol Luz in [facility] but notes that she has not seen her in several years.      Past Medical History:  Diagnosis Date  . Anxiety   . Asthma   . Insomnia   . Migraine     There are no active problems to display for this patient.   Past Surgical History:  Procedure Laterality Date  . ABDOMINAL HYSTERECTOMY    . ABDOMINAL SURGERY     bowel surgery  . ADENOIDECTOMY    . APPENDECTOMY    . CESAREAN SECTION    . CHOLECYSTECTOMY    . HERNIA REPAIR    . nissen fundiplication    . TONSILLECTOMY      OB History    Gravida Para Term Preterm AB Living   2 2 2     2    SAB TAB Ectopic Multiple Live Births                   Home Medications    Prior to Admission medications   Medication Sig Start Date End Date Taking? Authorizing Provider  albuterol (PROVENTIL HFA;VENTOLIN HFA) 108 (90 Base) MCG/ACT inhaler Inhale 2 puffs into the lungs every 4 (four) hours as needed for wheezing or shortness of breath. 12/09/15   Samuel Jester, DO  doxycycline (VIBRAMYCIN) 100 MG capsule Take 1 capsule by mouth 2 (two) times daily. 07/18/16   [provider]  EPINEPHrine 0.3 mg/0.3 mL IJ SOAJ injection Inject 0.3 mLs (0.3 mg total) into the muscle once. 12/15/15    Dione Booze, MD  estradiol (ESTRACE) 2 MG tablet Take 2 mg by mouth daily.    [provider]  ibuprofen (ADVIL,MOTRIN) 600 MG tablet Take 1 tablet (600 mg total) by mouth 4 (four) times daily. 12/28/16   Ivery Quale, PA-C  levalbuterol (XOPENEX) 1.25 MG/0.5ML nebulizer solution Take 1.25 mg by nebulization every 4 (four) hours as needed for wheezing or shortness of breath.    [provider]  levofloxacin (LEVAQUIN) 500 MG tablet Take 1 tablet (500 mg total) by mouth daily. 12/28/16   Ivery Quale, PA-C  loratadine-pseudoephedrine (CLARITIN-D 12 HOUR) 5-120 MG tablet Take 1 tablet by mouth 2 (two) times daily. 12/28/16   Ivery Quale, PA-C  neomycin-polymyxin-hydrocortisone (CORTISPORIN) OTIC solution Place 3 drops into the right ear 4 (four) times daily. x10 days 01/04/17 01/14/17  Delynn Flavin M, DO  ondansetron (ZOFRAN ODT) 4 MG disintegrating tablet 4mg  ODT q4 hours prn nausea/vomit 07/20/16   Bethann Berkshire, MD  ondansetron (ZOFRAN) 4 MG tablet Take 1 tablet (4 mg total) by mouth every 8 (eight) hours as needed for nausea or vomiting. 07/21/16   Mackuen, Cindee Salt, MD  oxyCODONE-acetaminophen (PERCOCET/ROXICET) 5-325 MG  tablet Take 1 tablet by mouth every 6 (six) hours as needed. 07/20/16   Bethann Berkshire, MD  predniSONE (DELTASONE) 20 MG tablet Take 2 tablets (40 mg total) by mouth daily. 12/15/15   Dione Booze, MD  traMADol (ULTRAM) 50 MG tablet Take 1 tablet (50 mg total) by mouth every 6 (six) hours as needed. 12/28/16   Ivery Quale, PA-C    Family History Family History  Problem Relation Age of Onset  . Asthma Mother   . Asthma Father   . Asthma Other     Social History Social History  Substance Use Topics  . Smoking status: Never Smoker  . Smokeless tobacco: Never Used  . Alcohol use No     Allergies   Amoxicillin; Azithromycin; Ceftin [cefuroxime axetil]; Ciprofloxacin; Hibiclens [chlorhexidine gluconate]; Peanuts [peanut oil]; Penicillins; and  Latex   Review of Systems Review of Systems  Constitutional: Negative for fever.  HENT: Positive for ear discharge (yellow), ear pain and hearing loss. Negative for congestion, facial swelling, rhinorrhea, sinus pain, sinus pressure, sneezing and tinnitus.   Eyes: Negative for photophobia and visual disturbance.  Musculoskeletal: Negative for gait problem.  Skin: Negative for rash.  Neurological: Negative for dizziness.     Physical Exam Updated Vital Signs BP 131/79 (BP Location: Left Arm)   Pulse 60   Temp 97.9 F (36.6 C) (Oral)   Resp 18   Ht 5\' 3"  (1.6 m)   Wt 111.1 kg (245 lb)   SpO2 99%   BMI 43.40 kg/m   Physical Exam  Constitutional: She is oriented to person, place, and time. She appears well-developed and well-nourished. No distress.  HENT:  Head: Normocephalic and atraumatic.  Right Ear: External ear normal. There is drainage (yellow). No tenderness. A foreign body (has green ear tube in place) is present. No mastoid tenderness. Decreased hearing is noted.  Left Ear: Hearing, tympanic membrane, external ear and ear canal normal. No drainage.  No middle ear effusion. No decreased hearing is noted.  Mouth/Throat: Oropharynx is clear and moist. No oropharyngeal exudate.  Eyes: Conjunctivae and EOM are normal. Pupils are equal, round, and reactive to light.  Neck: Normal range of motion. Neck supple.  Cardiovascular: Normal rate, regular rhythm, normal heart sounds and intact distal pulses.   No murmur heard. Pulmonary/Chest: Effort normal and breath sounds normal. No respiratory distress. She has no wheezes.  Musculoskeletal: Normal range of motion.  Lymphadenopathy:    She has no cervical adenopathy.  Neurological: She is alert and oriented to person, place, and time.  Skin: Skin is warm and dry. Capillary refill takes less than 2 seconds. No rash noted. She is not diaphoretic.  Psychiatric: She has a normal mood and affect. Her behavior is normal. Judgment and  thought content normal.  Nursing note and vitals reviewed.   ED Treatments / Results  Labs (all labs ordered are listed, but only abnormal results are displayed) Labs Reviewed - No data to display  EKG  EKG Interpretation None       Radiology No results found.  Procedures Procedures (including critical care time)  Medications Ordered in ED Medications - No data to display   Initial Impression / Assessment and Plan / ED Course  I have reviewed the triage vital signs and the nursing notes.  Pertinent labs & imaging results that were available during my care of the patient were reviewed by me and considered in my medical decision making (see chart for details).  Final Clinical Impressions(s) / ED Diagnoses   Final diagnoses:  Other infective acute otitis externa of right ear   Daisy Harris is a 31 y.o. female that presents to ED for right ear pain and drainage.  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  She demonstrates no systemic signs of illness.  However, there is visible yellow drainage in the right ear canal on exam. No bleeding.  Ear tube in place.  She is being discharged with Cortisporin ear gtts, as she has anaphylactic reaction to Cipro.  Home care instructions reviewed.  She will schedule follow up with her ENT, Dr Ferol LuzBogard on Monday for evaluation.  Return precautions reviewed.  Follow up prn.  New Prescriptions Discharge Medication List as of 01/04/2017 11:29 AM    START taking these medications   Details  neomycin-polymyxin-hydrocortisone (CORTISPORIN) OTIC solution Place 3 drops into the right ear 4 (four) times daily. x10 days, Starting Sat 01/04/2017, Until Tue 01/14/2017, Print         Delynn FlavinGottschalk, Ashly NovingerM, DO 01/04/17 1147    Blane OharaZavitz, Joshua, MD 01/04/17 1520

## 2017-01-04 NOTE — ED Triage Notes (Signed)
Patient c/o right ear pain that started last night. Patient reports yellow drainage. Denies any fevers. Patient states she was seen here in ER last Saturday and given antibiotics for bulging eardrum and infection  In right ear. Patient seen PCP Wednesday in which ear was checked and she was told eardrum looked good-no redness.

## 2017-01-04 NOTE — Discharge Instructions (Signed)
I recommend that you schedule an appt with Dr Ferol LuzBogard on Monday for reevaluation.  You are being discharged with ear drops to use FOUR times daily.  Follow up with your PCP in the next week for recheck as well.

## 2017-01-04 NOTE — ED Notes (Signed)
Pt requests an ear drop that medicaid will cover.

## 2017-05-27 IMAGING — DX DG CHEST 2V
2 series · 2 of 2 positions shown · non-contrast
Comparison: None.

CLINICAL DATA: Generalized chest pain and shortness of breath since
8 p.m. last night. Headache. History of asthma.

EXAM:
CHEST  2 VIEW

[chest pa]
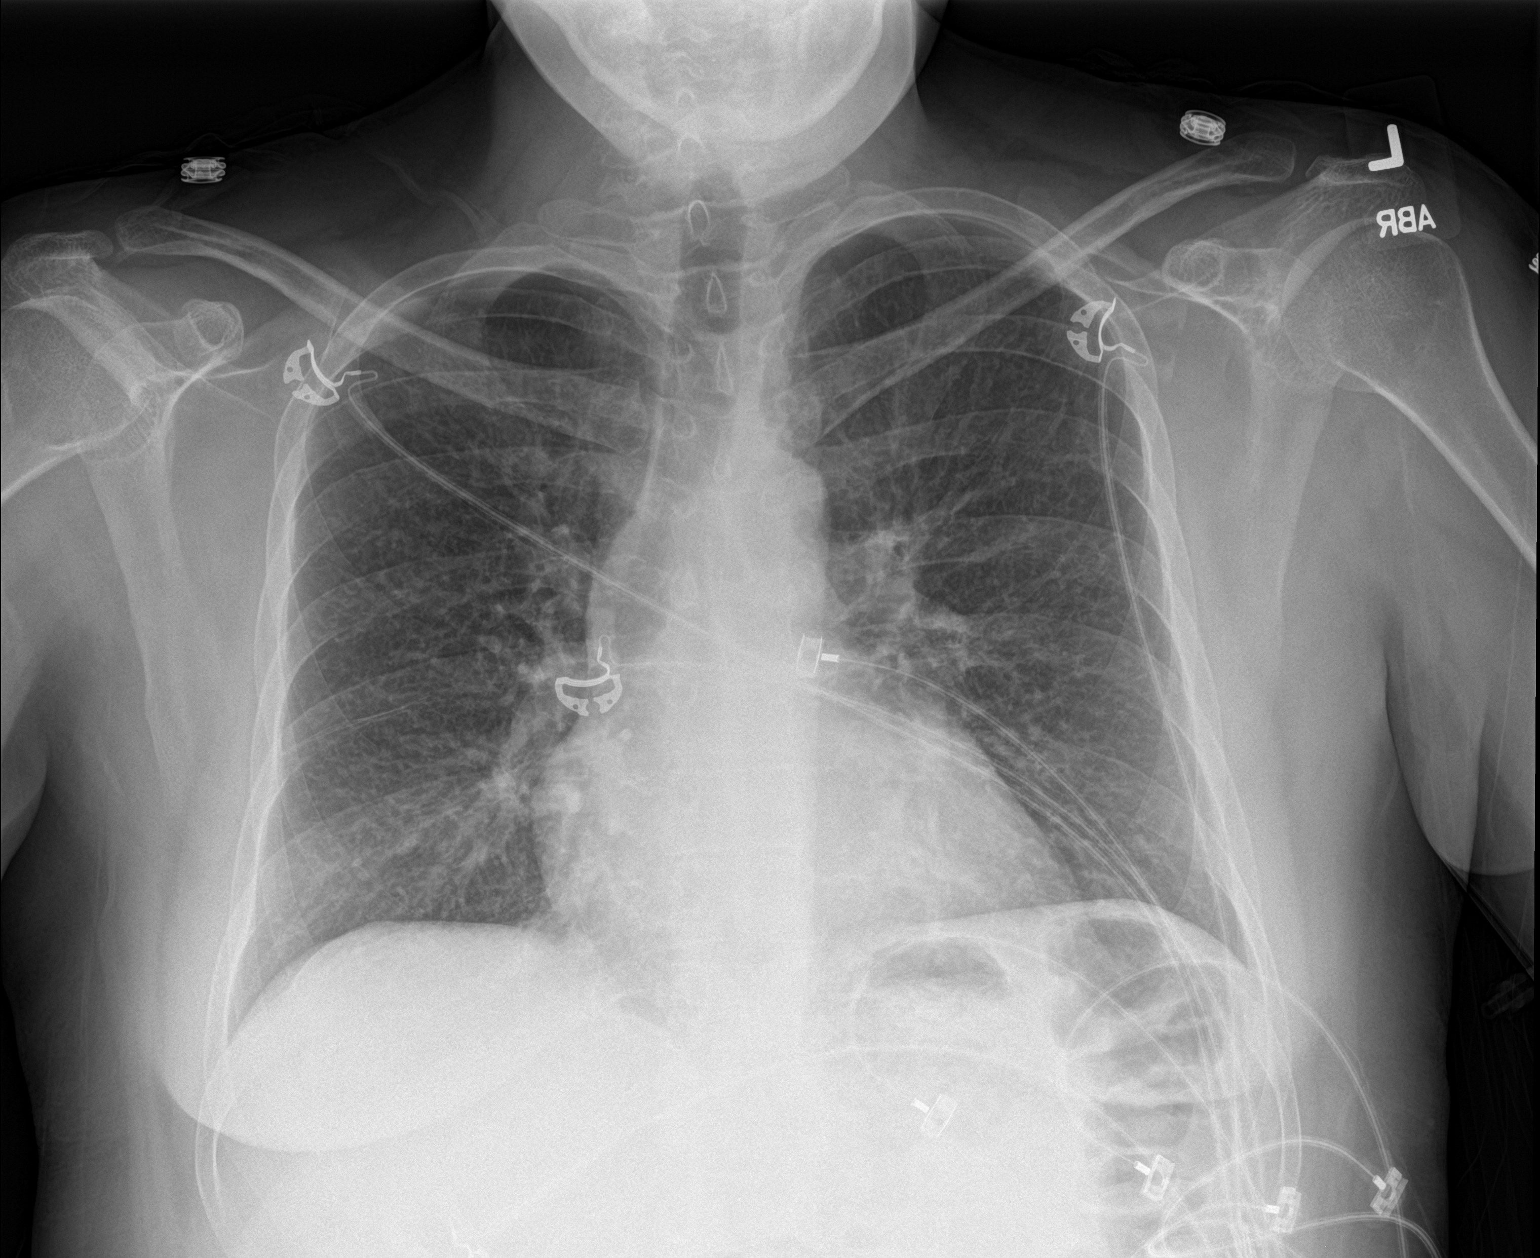

[chest lat]
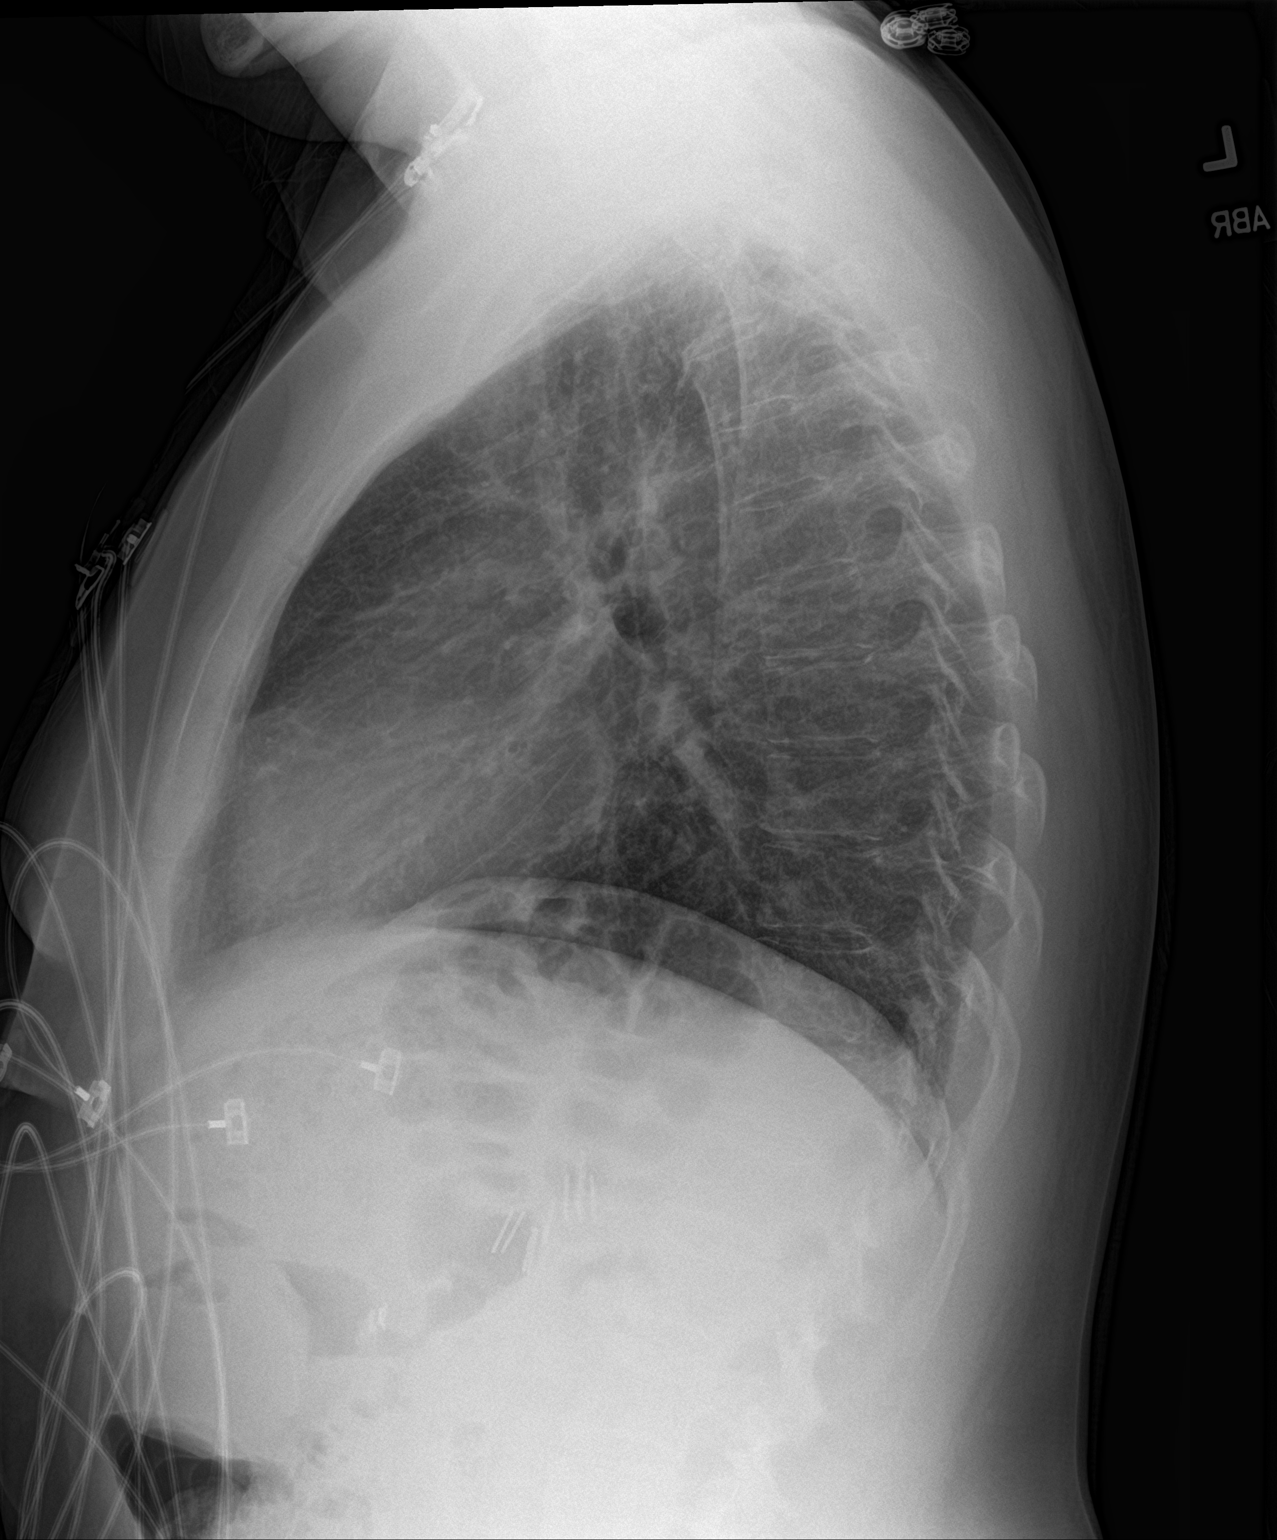

[2 of 2 positions shown; findings below may reference images not displayed]

FINDINGS: The heart size and mediastinal contours are within normal limits.
Both lungs are clear. The visualized skeletal structures are
unremarkable.
IMPRESSION: No active cardiopulmonary disease.

## 2018-09-26 DIAGNOSIS — R110 Nausea: Secondary | ICD-10-CM | POA: Diagnosis not present

## 2018-09-26 DIAGNOSIS — K439 Ventral hernia without obstruction or gangrene: Secondary | ICD-10-CM | POA: Diagnosis not present

## 2018-09-26 DIAGNOSIS — Z90710 Acquired absence of both cervix and uterus: Secondary | ICD-10-CM | POA: Diagnosis not present

## 2018-09-26 DIAGNOSIS — Z880 Allergy status to penicillin: Secondary | ICD-10-CM | POA: Diagnosis not present

## 2018-09-26 DIAGNOSIS — Z9049 Acquired absence of other specified parts of digestive tract: Secondary | ICD-10-CM | POA: Diagnosis not present

## 2018-09-26 DIAGNOSIS — R10816 Epigastric abdominal tenderness: Secondary | ICD-10-CM | POA: Diagnosis not present

## 2018-10-02 ENCOUNTER — Other Ambulatory Visit: Payer: Self-pay

## 2018-10-02 ENCOUNTER — Emergency Department (HOSPITAL_COMMUNITY): Payer: Medicaid Other

## 2018-10-02 ENCOUNTER — Encounter (HOSPITAL_COMMUNITY): Payer: Self-pay

## 2018-10-02 ENCOUNTER — Emergency Department (HOSPITAL_COMMUNITY)
Admission: EM | Admit: 2018-10-02 | Discharge: 2018-10-03 | Disposition: A | Discharge status: Home / Self Care | Payer: Medicaid Other | Attending: Emergency Medicine | Admitting: Emergency Medicine

## 2018-10-02 DIAGNOSIS — R109 Unspecified abdominal pain: Secondary | ICD-10-CM | POA: Diagnosis present

## 2018-10-02 DIAGNOSIS — K439 Ventral hernia without obstruction or gangrene: Secondary | ICD-10-CM

## 2018-10-02 DIAGNOSIS — Z91040 Latex allergy status: Secondary | ICD-10-CM | POA: Diagnosis not present

## 2018-10-02 DIAGNOSIS — Z79899 Other long term (current) drug therapy: Secondary | ICD-10-CM | POA: Diagnosis not present

## 2018-10-02 DIAGNOSIS — J45909 Unspecified asthma, uncomplicated: Secondary | ICD-10-CM | POA: Diagnosis not present

## 2018-10-02 LAB — CBC WITH DIFFERENTIAL/PLATELET
Abs Immature Granulocytes: 0.01 10*3/uL (ref 0.00–0.07)
BASOS ABS: 0.1 10*3/uL (ref 0.0–0.1)
Basophils Relative: 1 %
EOS ABS: 0.4 10*3/uL (ref 0.0–0.5)
EOS PCT: 5 %
HCT: 44.9 % (ref 36.0–46.0)
Hemoglobin: 14 g/dL (ref 12.0–15.0)
Immature Granulocytes: 0 %
Lymphocytes Relative: 42 %
Lymphs Abs: 2.7 10*3/uL (ref 0.7–4.0)
MCH: 28.2 pg (ref 26.0–34.0)
MCHC: 31.2 g/dL (ref 30.0–36.0)
MCV: 90.5 fL (ref 80.0–100.0)
MONO ABS: 0.5 10*3/uL (ref 0.1–1.0)
Monocytes Relative: 8 %
NRBC: 0 % (ref 0.0–0.2)
Neutro Abs: 2.8 10*3/uL (ref 1.7–7.7)
Neutrophils Relative %: 44 %
Platelets: 240 10*3/uL (ref 150–400)
RBC: 4.96 MIL/uL (ref 3.87–5.11)
RDW: 13.8 % (ref 11.5–15.5)
WBC: 6.5 10*3/uL (ref 4.0–10.5)

## 2018-10-02 LAB — COMPREHENSIVE METABOLIC PANEL
ALK PHOS: 104 U/L (ref 38–126)
ALT: 18 U/L (ref 0–44)
ANION GAP: 7 (ref 5–15)
AST: 21 U/L (ref 15–41)
Albumin: 4 g/dL (ref 3.5–5.0)
BILIRUBIN TOTAL: 0.6 mg/dL (ref 0.3–1.2)
BUN: 17 mg/dL (ref 6–20)
CALCIUM: 8.9 mg/dL (ref 8.9–10.3)
CO2: 24 mmol/L (ref 22–32)
CREATININE: 0.91 mg/dL (ref 0.44–1.00)
Chloride: 107 mmol/L (ref 98–111)
Glucose, Bld: 110 mg/dL — ABNORMAL HIGH (ref 70–99)
Potassium: 3.7 mmol/L (ref 3.5–5.1)
SODIUM: 138 mmol/L (ref 135–145)
TOTAL PROTEIN: 6.2 g/dL — AB (ref 6.5–8.1)

## 2018-10-02 LAB — LIPASE, BLOOD: LIPASE: 29 U/L (ref 11–51)

## 2018-10-02 LAB — LACTIC ACID, PLASMA: LACTIC ACID, VENOUS: 1.2 mmol/L (ref 0.5–1.9)

## 2018-10-02 MED ORDER — LACTATED RINGERS IV BOLUS
1000.0000 mL | Freq: Once | INTRAVENOUS | Status: AC
  Administered 2018-10-02: 1000 mL via INTRAVENOUS

## 2018-10-02 MED ORDER — IOHEXOL 300 MG/ML  SOLN
100.0000 mL | Freq: Once | INTRAMUSCULAR | Status: AC | PRN
  Administered 2018-10-02: 100 mL via INTRAVENOUS

## 2018-10-02 MED ORDER — ONDANSETRON HCL 4 MG/2ML IJ SOLN
4.0000 mg | Freq: Once | INTRAMUSCULAR | Status: AC
  Administered 2018-10-02: 4 mg via INTRAVENOUS
  Filled 2018-10-02: qty 2

## 2018-10-02 NOTE — ED Provider Notes (Signed)
[facility] EMERGENCY DEPARTMENT Provider Note   CSN: [id number] Arrival date & time: 10/02/18  2044    History   Chief Complaint Chief Complaint  Patient presents with  . Abdominal Pain    hernia repair with tear  . Near Syncope    HPI Daisy Harris is a 33 y.o. female.      Abdominal Pain  Pain location:  Periumbilical Pain quality: sharp   Pain radiates to:  Does not radiate Pain severity:  Severe Onset quality:  Gradual Duration:  2 weeks Timing:  Constant Progression:  Worsening Chronicity:  Recurrent Associated symptoms: nausea and vomiting   Associated symptoms: no chest pain, no chills, no cough, no dysuria, no fever, no hematuria, no shortness of breath and no sore throat      Past Medical History:  Diagnosis Date  . Anxiety   . Asthma   . Insomnia   . Migraine     There are no active problems to display for this patient.   Past Surgical History:  Procedure Laterality Date  . ABDOMINAL HYSTERECTOMY    . ABDOMINAL SURGERY     bowel surgery  . ADENOIDECTOMY    . APPENDECTOMY    . CESAREAN SECTION    . CHOLECYSTECTOMY    . HERNIA REPAIR    . nissen fundiplication    . TONSILLECTOMY       OB History    Gravida  2   Para  2   Term  2   Preterm      AB      Living  2     SAB      TAB      Ectopic      Multiple      Live Births               Home Medications    Prior to Admission medications   Medication Sig Start Date End Date Taking? Authorizing Provider  albuterol (PROVENTIL HFA;VENTOLIN HFA) 108 (90 Base) MCG/ACT inhaler Inhale 2 puffs into the lungs every 4 (four) hours as needed for wheezing or shortness of breath. 12/09/15  Yes Samuel Jester, DO  EPINEPHrine 0.3 mg/0.3 mL IJ SOAJ injection Inject 0.3 mLs (0.3 mg total) into the muscle once. Patient taking differently: Inject 0.3 mg into the muscle daily as needed for anaphylaxis.  12/15/15  Yes Dione Booze, MD  L-Tryptophan 500 MG CAPS  Take 3,000 mg by mouth at bedtime.   Yes [provider]  levalbuterol (XOPENEX) 1.25 MG/0.5ML nebulizer solution Take 1.25 mg by nebulization every 4 (four) hours as needed for wheezing or shortness of breath.   Yes [provider]  Melatonin 5 MG TABS Take 10 mg by mouth at bedtime.   Yes [provider]  OVER THE COUNTER MEDICATION Apply 1 application topically at bedtime. Estrogen cream   Yes [provider]  OVER THE COUNTER MEDICATION Take 3 tablets by mouth at bedtime. Estrogen motabolism   Yes [provider]  VALERIAN ROOT PO Take 3 tablets by mouth at bedtime.   Yes [provider]  ondansetron (ZOFRAN ODT) 4 MG disintegrating tablet Take 1 tablet (4 mg total) by mouth every 8 (eight) hours as needed for nausea or vomiting. 10/03/18   Alvira Monday, MD  oxyCODONE-acetaminophen (PERCOCET/ROXICET) 5-325 MG tablet Take 1 tablet by mouth every 4 (four) hours as needed for severe pain. 10/03/18   Alvira Monday, MD    Family History Family  History  Problem Relation Age of Onset  . Asthma Mother   . Asthma Father   . Asthma Other     Social History Social History   Tobacco Use  . Smoking status: Never Smoker  . Smokeless tobacco: Never Used  Substance Use Topics  . Alcohol use: No  . Drug use: No     Allergies   Amoxicillin; Azithromycin; Ceftin [cefuroxime axetil]; Ciprofloxacin; Hibiclens [chlorhexidine gluconate]; Peanuts [peanut oil]; Penicillins; and Latex   Review of Systems Review of Systems  Constitutional: Negative for chills and fever.  HENT: Negative for ear pain and sore throat.   Eyes: Negative for pain and visual disturbance.  Respiratory: Negative for cough and shortness of breath.   Cardiovascular: Negative for chest pain and palpitations.  Gastrointestinal: Positive for abdominal pain, nausea and vomiting.  Genitourinary: Negative for dysuria and hematuria.  Musculoskeletal: Negative for  arthralgias and back pain.  Skin: Negative for color change and rash.  Neurological: Negative for seizures and syncope.  All other systems reviewed and are negative.    Physical Exam Updated Vital Signs BP 109/63   Pulse 72   Temp 98.4 F (36.9 C) (Oral)   Resp 16   Ht  (1.6 m)   Wt 106.5 kg   SpO2 99%   BMI 41.59 kg/m   Physical Exam Vitals signs and nursing note reviewed.  Constitutional:      General: She is not in acute distress.    Appearance: She is well-developed.  HENT:     Head: Normocephalic and atraumatic.  Eyes:     Conjunctiva/sclera: Conjunctivae normal.  Neck:     Musculoskeletal: Neck supple.  Cardiovascular:     Rate and Rhythm: Normal rate and regular rhythm.     Heart sounds: No murmur.  Pulmonary:     Effort: Pulmonary effort is normal. No respiratory distress.     Breath sounds: Normal breath sounds.  Abdominal:     Comments: Patient's abdomen is soft and nondistended.  She has multiple well-healed surgical scars overlying the entirety of the abdomen.  Mild tenderness palpation over the umbilicus.  No rebound or guarding is present.  No palpable margins of suggest protruding hernia.  Skin:    General: Skin is warm and dry.  Neurological:     Mental Status: She is alert.      ED Treatments / Results  Labs (all labs ordered are listed, but only abnormal results are displayed) Labs Reviewed  COMPREHENSIVE METABOLIC PANEL - Abnormal; Notable for the following components:      Result Value   Glucose, Bld 110 (*)    Total Protein 6.2 (*)    All other components within normal limits  CBC WITH DIFFERENTIAL/PLATELET  LIPASE, BLOOD  LACTIC ACID, PLASMA  LACTIC ACID, PLASMA    EKG None  Radiology Ct Abdomen Pelvis W Contrast  Result Date: 10/02/2018 CLINICAL DATA:  Worsening abdominal pain from previous hernia repair that has torn. Pain at the umbilical area started a few weeks ago. History of multiple ventral hernia revisions. EXAM:  CT ABDOMEN AND PELVIS WITH CONTRAST TECHNIQUE: Multidetector CT imaging of the abdomen and pelvis was performed using the standard protocol following bolus administration of intravenous contrast. CONTRAST:  OMNIPAQUE IOHEXOL 300 MG/ML  SOLN COMPARISON:  07/21/2016 FINDINGS: Lower chest: Lung bases are clear. Small esophageal hiatal hernia. Hepatobiliary: No focal liver abnormality is seen. Status post cholecystectomy. No biliary dilatation. Pancreas: Unremarkable. No pancreatic ductal dilatation or surrounding inflammatory changes.  Spleen: Normal in size without focal abnormality. Adrenals/Urinary Tract: Adrenal glands are unremarkable. Kidneys are normal, without renal calculi, focal lesion, or hydronephrosis. Bladder is unremarkable. Stomach/Bowel: Stomach, small bowel, and colon are not abnormally distended. Scattered stool throughout the colon. Small bowel anastomosis in the mid abdomen. Appendix is surgically absent. No wall thickening or inflammatory changes demonstrated in the bowel. Vascular/Lymphatic: No significant vascular findings are present. No enlarged abdominal or pelvic lymph nodes. Reproductive: Status post hysterectomy. No adnexal masses. Other: No free air or free fluid in the abdomen. Small ventral abdominal wall hernia containing fat and a portion of small bowel. No proximal small bowel obstruction. Scarring in the anterior abdominal wall consistent with postoperative change. Surgical clips in the left upper quadrant. Musculoskeletal: No acute or significant osseous findings. IMPRESSION: Small residual or recurrent ventral abdominal wall hernia containing fat and a portion of small bowel. No proximal obstruction. No inflammatory changes in bowel. Small esophageal hiatal hernia. Electronically Signed   By: Burman Nieves M.D.   On: 10/02/2018 23:07    Procedures Procedures (including critical care time)  Medications Ordered in ED Medications  lactated ringers bolus 1,000 mL  (1,000 mLs Intravenous New Bag/Given 10/02/18 2159)  ondansetron (ZOFRAN) injection 4 mg (4 mg Intravenous Given 10/02/18 2200)  iohexol (OMNIPAQUE) 300 MG/ML solution 100 mL (100 mLs Intravenous Contrast Given 10/02/18 2251)     Initial Impression / Assessment and Plan / ED Course  I have reviewed the triage vital signs and the nursing notes.  Pertinent labs & imaging results that were available during my care of the patient were reviewed by me and considered in my medical decision making (see chart for details).        Patient is a 33 year old female with extensive past abdominal surgical history.  Of note patient has had ventral hernia surgery with failure 3 years ago requiring repair.  Patient states that she has had increasing abdominal pain over the past 2 weeks that mimics prior hernia repair failure.  She states that pain has progressively worsened since onset and she is attempting to establish care with a new surgeon in [place] however the appointment is not until 3 days from now.  She states the pain got to the point earlier this evening that she cannot wait for outpatient follow-up.  On initial evaluation the patient she was hemodynamically stable and nontoxic-appearing.  Patient was afebrile, not tachycardic, normotensive, satting appropriately on room air.  Physical exam as detailed above which were remarkable for multiple well-healing surgical scars across the patient's abdomen.  Patient has tenderness to palpation about the umbilicus however there is no rebound or guarding present.  No easily identifiable margins of protruding hernia.  Patient was offered IV pain medication at this time however she refused.  She was given IV Zofran and IV fluid bolus as she states that she has had some nausea and vomiting since pain is worsened earlier today.  CBC within normal limits.  No leukocytosis and hemoglobin within normal limits.  CMP with no significant metabolic or electrolyte  derangements.  Lactic acid within normal limits.  CT of the abdomen showing small residual or recurrent ventral abdominal wall hernia containing fat and portion of small bowel.  There is no proximal obstruction.  No inflammatory changes within the bowel.  Patient's case was discussed with on-call general surgeon Dr. Cliffton Asters.  As patient has no evidence of radiographic or clinical signs of bowel obstruction, she states that she is continued to have regular  bowel movements since onset of pain, is tolerating p.o., and has outpatient follow-up already scheduled in 3 days, she is safe for discharge.  I agree with this assessment as the patient is overall well-appearing on examination. She was provided with pain and antiemetic prescription prior to discharge. She was comfortable returning home at this time with general surgery follow up in 3 days. I discussed concerning signs and symptoms that would necessitate return to the ED before outpatient evaluation. She voiced understanding of these instructions and had no further questions at this time. Patient in stable condition at time of discharge.   Final Clinical Impressions(s) / ED Diagnoses   Final diagnoses:  Ventral hernia without obstruction or gangrene    ED Discharge Orders         Ordered    oxyCODONE-acetaminophen (PERCOCET/ROXICET) 5-325 MG tablet  Every 4 hours PRN     10/03/18 0021    ondansetron (ZOFRAN ODT) 4 MG disintegrating tablet  Every 8 hours PRN     10/03/18 0022           Leonette Monarch, MD 10/03/18 1610    Alvira Monday, MD 10/03/18 1312

## 2018-10-02 NOTE — ED Triage Notes (Signed)
Pt here with worsening abd pain from previous hernia repair that has torn.  Pain 9/10 at umbilical area. Started a few weeks ago.Has appointment with surgeon on Monday, however states pain was unbearable and had to be seen in ER. Also c/o dizziness/lightheadedness that started today.  Woke up "not feeling right" and states noticed it more/got worse when she went to the grocery store.

## 2018-10-03 MED ORDER — ONDANSETRON 4 MG PO TBDP: 4.0000 mg | ORAL_TABLET | Freq: Three times a day (TID) | ORAL | 0 refills | Status: AC | PRN

## 2018-10-03 MED ORDER — OXYCODONE-ACETAMINOPHEN 5-325 MG PO TABS: 1.0000 | ORAL_TABLET | ORAL | 0 refills | Status: DC | PRN

## 2018-10-03 NOTE — ED Notes (Signed)
ED Provider at bedside. 

## 2018-10-03 NOTE — ED Notes (Signed)
Pt constantly yelling from her room she is in pain. I administered PO pain medication as ordered and also placed heat packs over a sheet on pt back. 30 seconds after this the pt called out informing me she still has pain. I let the patient know that her pain will not go away in 30 seconds and to give it time for the heat packs and PO medication to take effect. Pt continued crying out and stated to take her back to her facility.

## 2018-10-03 NOTE — ED Notes (Signed)
Patient verbalizes understanding of discharge instructions. Opportunity for questioning and answers were provided. Armband removed by staff, pt discharged from ED ambulatory.   

## 2018-10-05 DIAGNOSIS — K429 Umbilical hernia without obstruction or gangrene: Secondary | ICD-10-CM | POA: Diagnosis not present

## 2018-12-21 DIAGNOSIS — Z1159 Encounter for screening for other viral diseases: Secondary | ICD-10-CM | POA: Diagnosis not present

## 2018-12-21 DIAGNOSIS — Z01812 Encounter for preprocedural laboratory examination: Secondary | ICD-10-CM | POA: Diagnosis not present

## 2018-12-21 DIAGNOSIS — K439 Ventral hernia without obstruction or gangrene: Secondary | ICD-10-CM | POA: Diagnosis not present

## 2018-12-25 DIAGNOSIS — Z1159 Encounter for screening for other viral diseases: Secondary | ICD-10-CM | POA: Diagnosis not present

## 2018-12-25 DIAGNOSIS — K219 Gastro-esophageal reflux disease without esophagitis: Secondary | ICD-10-CM | POA: Diagnosis not present

## 2018-12-25 DIAGNOSIS — K469 Unspecified abdominal hernia without obstruction or gangrene: Secondary | ICD-10-CM | POA: Diagnosis not present

## 2018-12-25 DIAGNOSIS — J45909 Unspecified asthma, uncomplicated: Secondary | ICD-10-CM | POA: Diagnosis not present

## 2018-12-25 DIAGNOSIS — Z01812 Encounter for preprocedural laboratory examination: Secondary | ICD-10-CM | POA: Diagnosis not present

## 2018-12-28 DIAGNOSIS — Z880 Allergy status to penicillin: Secondary | ICD-10-CM | POA: Diagnosis not present

## 2018-12-28 DIAGNOSIS — Z91040 Latex allergy status: Secondary | ICD-10-CM | POA: Diagnosis not present

## 2018-12-28 DIAGNOSIS — Z8711 Personal history of peptic ulcer disease: Secondary | ICD-10-CM | POA: Diagnosis not present

## 2018-12-28 DIAGNOSIS — Z91048 Other nonmedicinal substance allergy status: Secondary | ICD-10-CM | POA: Diagnosis not present

## 2018-12-28 DIAGNOSIS — K219 Gastro-esophageal reflux disease without esophagitis: Secondary | ICD-10-CM | POA: Diagnosis not present

## 2018-12-28 DIAGNOSIS — Z888 Allergy status to other drugs, medicaments and biological substances status: Secondary | ICD-10-CM | POA: Diagnosis not present

## 2018-12-28 DIAGNOSIS — Z91010 Allergy to peanuts: Secondary | ICD-10-CM | POA: Diagnosis not present

## 2018-12-28 DIAGNOSIS — Z87891 Personal history of nicotine dependence: Secondary | ICD-10-CM | POA: Diagnosis not present

## 2018-12-28 DIAGNOSIS — K567 Ileus, unspecified: Secondary | ICD-10-CM | POA: Diagnosis not present

## 2018-12-28 DIAGNOSIS — Z881 Allergy status to other antibiotic agents status: Secondary | ICD-10-CM | POA: Diagnosis not present

## 2018-12-28 DIAGNOSIS — K432 Incisional hernia without obstruction or gangrene: Secondary | ICD-10-CM | POA: Diagnosis not present

## 2018-12-28 DIAGNOSIS — Z6841 Body Mass Index (BMI) 40.0 and over, adult: Secondary | ICD-10-CM | POA: Diagnosis not present

## 2019-01-28 DIAGNOSIS — K439 Ventral hernia without obstruction or gangrene: Secondary | ICD-10-CM | POA: Diagnosis not present

## 2019-01-28 DIAGNOSIS — Z9889 Other specified postprocedural states: Secondary | ICD-10-CM | POA: Diagnosis not present

## 2019-01-30 DIAGNOSIS — Z881 Allergy status to other antibiotic agents status: Secondary | ICD-10-CM | POA: Diagnosis not present

## 2019-01-30 DIAGNOSIS — Z79899 Other long term (current) drug therapy: Secondary | ICD-10-CM | POA: Diagnosis not present

## 2019-01-30 DIAGNOSIS — Z91040 Latex allergy status: Secondary | ICD-10-CM | POA: Diagnosis not present

## 2019-01-30 DIAGNOSIS — F319 Bipolar disorder, unspecified: Secondary | ICD-10-CM | POA: Diagnosis not present

## 2019-01-30 DIAGNOSIS — Z87891 Personal history of nicotine dependence: Secondary | ICD-10-CM | POA: Diagnosis not present

## 2019-01-30 DIAGNOSIS — M5136 Other intervertebral disc degeneration, lumbar region: Secondary | ICD-10-CM | POA: Diagnosis not present

## 2019-01-30 DIAGNOSIS — Z880 Allergy status to penicillin: Secondary | ICD-10-CM | POA: Diagnosis not present

## 2019-01-30 DIAGNOSIS — G4700 Insomnia, unspecified: Secondary | ICD-10-CM | POA: Diagnosis not present

## 2019-01-30 DIAGNOSIS — J45909 Unspecified asthma, uncomplicated: Secondary | ICD-10-CM | POA: Diagnosis not present

## 2019-01-30 DIAGNOSIS — F419 Anxiety disorder, unspecified: Secondary | ICD-10-CM | POA: Diagnosis not present

## 2019-01-30 DIAGNOSIS — Z79891 Long term (current) use of opiate analgesic: Secondary | ICD-10-CM | POA: Diagnosis not present

## 2019-01-30 DIAGNOSIS — G8918 Other acute postprocedural pain: Secondary | ICD-10-CM | POA: Diagnosis not present

## 2019-01-30 DIAGNOSIS — Z888 Allergy status to other drugs, medicaments and biological substances status: Secondary | ICD-10-CM | POA: Diagnosis not present

## 2019-01-30 DIAGNOSIS — Z91010 Allergy to peanuts: Secondary | ICD-10-CM | POA: Diagnosis not present

## 2019-02-01 DIAGNOSIS — F319 Bipolar disorder, unspecified: Secondary | ICD-10-CM | POA: Diagnosis not present

## 2019-02-01 DIAGNOSIS — J4520 Mild intermittent asthma, uncomplicated: Secondary | ICD-10-CM | POA: Diagnosis not present

## 2019-02-01 DIAGNOSIS — Z881 Allergy status to other antibiotic agents status: Secondary | ICD-10-CM | POA: Diagnosis not present

## 2019-02-01 DIAGNOSIS — K219 Gastro-esophageal reflux disease without esophagitis: Secondary | ICD-10-CM | POA: Diagnosis not present

## 2019-02-01 DIAGNOSIS — Z87891 Personal history of nicotine dependence: Secondary | ICD-10-CM | POA: Diagnosis not present

## 2019-02-01 DIAGNOSIS — Z91010 Allergy to peanuts: Secondary | ICD-10-CM | POA: Diagnosis not present

## 2019-02-01 DIAGNOSIS — Z888 Allergy status to other drugs, medicaments and biological substances status: Secondary | ICD-10-CM | POA: Diagnosis not present

## 2019-02-01 DIAGNOSIS — Z1159 Encounter for screening for other viral diseases: Secondary | ICD-10-CM | POA: Diagnosis not present

## 2019-02-01 DIAGNOSIS — R109 Unspecified abdominal pain: Secondary | ICD-10-CM | POA: Diagnosis not present

## 2019-02-01 DIAGNOSIS — Z91040 Latex allergy status: Secondary | ICD-10-CM | POA: Diagnosis not present

## 2019-02-01 DIAGNOSIS — Z9109 Other allergy status, other than to drugs and biological substances: Secondary | ICD-10-CM | POA: Diagnosis not present

## 2019-02-01 DIAGNOSIS — Z880 Allergy status to penicillin: Secondary | ICD-10-CM | POA: Diagnosis not present

## 2019-02-01 DIAGNOSIS — Z6841 Body Mass Index (BMI) 40.0 and over, adult: Secondary | ICD-10-CM | POA: Diagnosis not present

## 2019-02-01 DIAGNOSIS — K91872 Postprocedural seroma of a digestive system organ or structure following a digestive system procedure: Secondary | ICD-10-CM | POA: Diagnosis not present

## 2019-02-11 DIAGNOSIS — T888XXA Other specified complications of surgical and medical care, not elsewhere classified, initial encounter: Secondary | ICD-10-CM | POA: Diagnosis not present

## 2019-02-26 DIAGNOSIS — Z9889 Other specified postprocedural states: Secondary | ICD-10-CM | POA: Diagnosis not present

## 2019-12-17 ENCOUNTER — Ambulatory Visit
Admission: EM | Admit: 2019-12-17 | Discharge: 2019-12-17 | Disposition: A | Discharge status: Home / Self Care | Payer: Medicaid Other | Attending: Emergency Medicine | Admitting: Emergency Medicine

## 2019-12-17 ENCOUNTER — Ambulatory Visit (INDEPENDENT_AMBULATORY_CARE_PROVIDER_SITE_OTHER): Payer: Medicaid Other

## 2019-12-17 DIAGNOSIS — M79645 Pain in left finger(s): Secondary | ICD-10-CM | POA: Diagnosis not present

## 2019-12-17 DIAGNOSIS — M79642 Pain in left hand: Secondary | ICD-10-CM | POA: Diagnosis not present

## 2019-12-17 DIAGNOSIS — T07XXXA Unspecified multiple injuries, initial encounter: Secondary | ICD-10-CM

## 2019-12-17 DIAGNOSIS — S6992XA Unspecified injury of left wrist, hand and finger(s), initial encounter: Secondary | ICD-10-CM

## 2019-12-17 MED ORDER — NAPROXEN 500 MG PO TABS: 500.0000 mg | ORAL_TABLET | Freq: Two times a day (BID) | ORAL | 0 refills | Status: AC

## 2019-12-17 MED ORDER — CYCLOBENZAPRINE HCL 10 MG PO TABS: 10.0000 mg | ORAL_TABLET | Freq: Every day | ORAL | 0 refills | Status: AC

## 2019-12-17 MED ORDER — MUPIROCIN 2 % EX OINT
1.0000 | TOPICAL_OINTMENT | Freq: Two times a day (BID) | CUTANEOUS | 0 refills | Status: AC
Start: 2019-12-17 — End: ?
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
## 2019-12-17 NOTE — Discharge Instructions (Signed)
X-rays negative for fracture or dislocation Rest, ice and heat as needed Ensure adequate range of motion as tolerated. Injuries all appear to be muscular in nature at this time Prescribed naproxen as needed for inflammation and pain relief.  DO NOT TAKE WITH OTHER antiinflammatories, as this may cause GI upset and/or bleed Prescribed flexeril as needed at bedtime for muscle spasm.  Do not drive or operate heavy machinery while taking this medication Expect some increased pain in the next 1-3 days.  It may take 3-4 weeks for complete resolution of symptoms Will f/u with her doctor or here if not seeing significant improvement within one week. Return here or go to ER if you have any new or worsening symptoms such as numbness/tingling of the inner thighs, loss of bladder or bowel control, headache/blurry vision, nausea/vomiting, confusion/altered mental status, dizziness, weakness, passing out, imbalance, etc..Marland Kitchen
